# Patient Record
Sex: Female | Born: 1945
Health system: Southern US, Community
[De-identification: ages and names within clinical notes are randomized; demographics above are authoritative.]

## PROBLEM LIST (undated history)

## (undated) DIAGNOSIS — F419 Anxiety disorder, unspecified: Secondary | ICD-10-CM

## (undated) HISTORY — PX: ROBOTIC ASSISTED LAPAROSCOPIC SACROCOLPOPEXY: SHX5388

## (undated) HISTORY — PX: ABDOMINAL HYSTERECTOMY: SHX81

## (undated) HISTORY — PX: TONSILLECTOMY: SUR1361

## (undated) HISTORY — PX: CYST EXCISION: SHX5701

---

## 2009-05-22 ENCOUNTER — Ambulatory Visit: Payer: Self-pay | Admitting: Cardiology

## 2010-05-30 ENCOUNTER — Ambulatory Visit
Admission: RE | Admit: 2010-05-30 | Discharge: 2010-05-30 | Payer: Self-pay | Source: Home / Self Care | Attending: Internal Medicine | Admitting: Internal Medicine

## 2010-06-22 ENCOUNTER — Other Ambulatory Visit (INDEPENDENT_AMBULATORY_CARE_PROVIDER_SITE_OTHER): Payer: Self-pay | Admitting: Internal Medicine

## 2010-06-22 ENCOUNTER — Ambulatory Visit (HOSPITAL_COMMUNITY)
Admission: RE | Admit: 2010-06-22 | Discharge: 2010-06-22 | Disposition: A | Discharge status: Home / Self Care | Payer: Medicare Other | Source: Ambulatory Visit | Attending: Internal Medicine | Admitting: Internal Medicine

## 2010-06-22 ENCOUNTER — Encounter (HOSPITAL_BASED_OUTPATIENT_CLINIC_OR_DEPARTMENT_OTHER): Payer: Medicare Other | Admitting: Internal Medicine

## 2010-06-22 DIAGNOSIS — K6389 Other specified diseases of intestine: Secondary | ICD-10-CM

## 2010-06-22 DIAGNOSIS — E785 Hyperlipidemia, unspecified: Secondary | ICD-10-CM | POA: Insufficient documentation

## 2010-06-22 DIAGNOSIS — Z860100 Personal history of colon polyps, unspecified: Secondary | ICD-10-CM | POA: Insufficient documentation

## 2010-06-22 DIAGNOSIS — K644 Residual hemorrhoidal skin tags: Secondary | ICD-10-CM

## 2010-06-22 DIAGNOSIS — D128 Benign neoplasm of rectum: Secondary | ICD-10-CM | POA: Insufficient documentation

## 2010-06-22 DIAGNOSIS — R197 Diarrhea, unspecified: Secondary | ICD-10-CM | POA: Insufficient documentation

## 2010-06-22 DIAGNOSIS — K621 Rectal polyp: Secondary | ICD-10-CM

## 2010-06-22 DIAGNOSIS — Z7982 Long term (current) use of aspirin: Secondary | ICD-10-CM | POA: Insufficient documentation

## 2010-06-22 DIAGNOSIS — K921 Melena: Secondary | ICD-10-CM

## 2010-06-22 DIAGNOSIS — R198 Other specified symptoms and signs involving the digestive system and abdomen: Secondary | ICD-10-CM

## 2010-07-16 NOTE — Consult Note (Signed)
NAME:  Sarah Coffey, Sarah Coffey              ACCOUNT NO.:  [id number]  MEDICAL RECORD NO.:  [id number]           PATIENT TYPE: AMB  LOCATION:  [place]                               FACILITY: [facility]  PHYSICIAN:  Lionel December, M.D.    DATE OF BIRTH:  [birth year]  DATE OF CONSULTATION: 05/30/2010                                 CONSULTATION   REASON FOR CONSULT:  Pencil thin stools, rectal bleeding.  HISTORY OF PRESENT ILLNESS:  Sarah Coffey is a 65 year old female referred to our office by Dr. Margo Aye in [place], [place].  She is complaining of pencil thin stools and rectal bleeding that she underwent a oophorectomy in March of last year.  She states she has rectal bleeding when she takes Advil.  She takes Advil about 2-3 times a week.  She does see blood on the toilet tissues.  Her stools have been brown.  She is having approximately 6 small stools a day.  She states when she goes to the bathroom, she feels like she is not completely empty.  Her stools are pencil thin.  She also complains of a bloating sensation and weight gain.  Her last colonoscopy was in 2008 in [place] which she reports was normal.  In 2003, she underwent a colonoscopy in [place] and she did have polyps, but I do not have access to those records.  She denies any fever or fatigue.  There has been no appetite changes, no dysphagia. She has had rectal bleeding, but no melena.  There has been no weight loss.  She has actually gained weight.  She is allergic to SULFA.  SURGERY:  She has had a tonsillectomy in 1950s, partial hysterectomy in 1987.  She had oophorectomy in March 2011.  She has a history of high cholesterol and depression.  FAMILY HISTORY:  Her mother is alive in good health at age 51.  Her father is deceased with a history of coronary artery disease  and a bleeding ulcer.  One brother and sister in good health.  SOCIAL HISTORY:  She is widowed.  She is a retired Retail buyer.  She smokes socially x5 years.  She did smoke a half a pack a day x20 years. She  will occasionally drinks wine and Scotch.  She has one child in good health.  SUBJECTIVE:  VITAL SIGNS:  Her weight is 156.5, her height is 5 feet 4 inches, her BMI is 26. MOUTH:  She has natural teeth.  Her oral mucosa is moist.  There are no lesions. EYES:  Her conjunctivae are pink.  Her sclerae are anicteric. NECK:  Her thyroid is normal.  There is no cervical lymphadenopathy. LUNGS:  Clear. HEART:  Regular rate and rhythm. ABDOMEN:  Soft.  Bowel sounds are present.  No masses.  She does have slight tenderness to the epigastric region.  Her stool was guaiac negative today.  She does have external hemorrhoids. EXTREMITIES:  There is no edema to her extremities.  LABORATORY DATA:  On May 16, 2010, hematocrit is 39.2, hemoglobin is 12.8, MCV is 100.3, platelet count is 286.  Creatinine 0.60, albumin 3.8, ALP is 64, BUN is 15, calcium is 9.3, chloride 105, glucose 91, potassium 4.7, SGOT 27, SGPT 27, sodium 140, total bilirubin 0.4, total protein 6.1.  ASSESSMENT:  Sarah Coffey is a 65 year old female presenting with pencil thin stools and she does give a history of rectal bleeding.  Her colonic ulcer needs to be ruled out as well as an AVM.  Also, a colonic carcinoma also needs to be rule out.  RECOMMENDATIONS:  We will schedule a colonoscopy in the near future and the risk and benefits were reviewed with the patient and the patient verbalizes understanding.    ______________________________ Dorene Ar, NP   ______________________________ Lionel December, M.D.    TS/MEDQ  D:  05/30/2010  T:  05/31/2010  Job:  [id number]  cc:   Catalina Pizza, M.D. Fax: [phone]  Electronically Signed by Dorene Ar PA on 06/26/2010 09:29:13 AM Electronically Signed by Lionel December M.D. on 07/16/2010 01:13:35 PM

## 2010-07-16 NOTE — Op Note (Signed)
NAME:  Sarah Coffey, Sarah Coffey              ACCOUNT NO.:  [id number]  MEDICAL RECORD NO.:  [id number]           PATIENT TYPE:  O  LOCATION:  [place]                          FACILITY:  [facility]  PHYSICIAN:  Lionel December, M.D.    DATE OF BIRTH:  07-27-1945  DATE OF PROCEDURE: DATE OF DISCHARGE:  06/22/2010                              OPERATIVE REPORT   PROCEDURE:  Colonoscopy.  INDICATION:  [name] is a 65 year old Caucasian female who has history of colonic polyps, who has developed thin caliber stools since she had oophorectomy in March last year.  She is having 5-6 stools per day.  She also has intermittent hematochezia.  The patient had three polyps removed in a colonoscopy 9 years ago, but none on exam 4 years ago. Procedure risks were reviewed with the patient.  Informed consent was obtained.  MEDICATIONS FOR CONSCIOUS SEDATION:  Demerol 50 mg IV and Versed 5 mg IV.  FINDINGS:  Procedure performed in endoscopy suite.  The patient's vital signs and O2 sat were monitored during the procedure and remained stable.  The patient was placed in left lateral recumbent position. Rectal examination was performed.  No abnormality noted on external or digital exam.  Pentax videoscope was placed through rectum and advanced under vision into sigmoid colon and beyond.  Preparation was satisfactory.  She had a mild mucosal pigmentation consistent with melanosis coli.  No stricture was noted at the 20 segment of the colon. Scope was passed into cecum which was identified by appendiceal orifice and ileocecal valve.  As the scope was withdrawn, colonic mucosa was carefully examined and no masses, diverticula, or stricture was noted. In the rectum, there were few small polyps which had typical appearance of hyperplastic polyp with exception of one and this was ablated via cold biopsy.  Scope was retroflexed to examine anorectal junction and small hemorrhoids noted below the dentate line.  Endoscope  was straightened and withdrawn.  Withdrawal time was 9 minutes.  The patient tolerated the procedure well.  FINAL DIAGNOSES: 1. No evidence of colonic stricture or neoplasm. 2. Mild changes of melanosis coli. 3. Small polyp ablated via cold biopsy from the rectum. 4. External hemorrhoids. 5. I suspect the patient's symptoms would appear to be due to     irritable bowel syndrome.  She does not have any structural     abnormality to account for these symptoms.  RECOMMENDATIONS: 1. She will continue high-fiber diet.  In addition to this, take fiber     supplement 3-4 g daily. 2. She will take dicyclomine 10 mg before breakfast daily.  The     patient will keep stool diary until her OV in 8 weeks.  I will also     ask that she could take few pictures of fecal matter on few     occasions. 3. I will be contacting the patient with results of biopsy as well.     Lionel December, M.D.     NR/MEDQ  D:  06/22/2010  T:  06/22/2010  Job:  [id number]  cc:   Catalina Pizza, M.D. Fax: [phone]  Electronically Signed by Lionel December  M.D. on 07/16/2010 01:12:30 PM

## 2010-07-24 ENCOUNTER — Ambulatory Visit (INDEPENDENT_AMBULATORY_CARE_PROVIDER_SITE_OTHER): Payer: Medicare Other | Admitting: Internal Medicine

## 2010-07-24 DIAGNOSIS — R198 Other specified symptoms and signs involving the digestive system and abdomen: Secondary | ICD-10-CM

## 2011-06-26 DIAGNOSIS — H538 Other visual disturbances: Secondary | ICD-10-CM | POA: Diagnosis not present

## 2011-06-26 DIAGNOSIS — H2510 Age-related nuclear cataract, unspecified eye: Secondary | ICD-10-CM | POA: Diagnosis not present

## 2011-07-10 DIAGNOSIS — E785 Hyperlipidemia, unspecified: Secondary | ICD-10-CM | POA: Diagnosis not present

## 2011-07-10 DIAGNOSIS — E559 Vitamin D deficiency, unspecified: Secondary | ICD-10-CM | POA: Diagnosis not present

## 2011-07-10 DIAGNOSIS — R635 Abnormal weight gain: Secondary | ICD-10-CM | POA: Diagnosis not present

## 2011-07-10 DIAGNOSIS — I10 Essential (primary) hypertension: Secondary | ICD-10-CM | POA: Diagnosis not present

## 2011-07-17 DIAGNOSIS — F411 Generalized anxiety disorder: Secondary | ICD-10-CM | POA: Diagnosis not present

## 2011-07-17 DIAGNOSIS — R635 Abnormal weight gain: Secondary | ICD-10-CM | POA: Diagnosis not present

## 2011-07-17 DIAGNOSIS — E785 Hyperlipidemia, unspecified: Secondary | ICD-10-CM | POA: Diagnosis not present

## 2011-07-17 DIAGNOSIS — F329 Major depressive disorder, single episode, unspecified: Secondary | ICD-10-CM | POA: Diagnosis not present

## 2012-01-14 DIAGNOSIS — Z79899 Other long term (current) drug therapy: Secondary | ICD-10-CM | POA: Diagnosis not present

## 2012-01-14 DIAGNOSIS — E785 Hyperlipidemia, unspecified: Secondary | ICD-10-CM | POA: Diagnosis not present

## 2012-01-18 DIAGNOSIS — F329 Major depressive disorder, single episode, unspecified: Secondary | ICD-10-CM | POA: Diagnosis not present

## 2012-01-18 DIAGNOSIS — F411 Generalized anxiety disorder: Secondary | ICD-10-CM | POA: Diagnosis not present

## 2012-01-18 DIAGNOSIS — E785 Hyperlipidemia, unspecified: Secondary | ICD-10-CM | POA: Diagnosis not present

## 2012-01-18 DIAGNOSIS — R635 Abnormal weight gain: Secondary | ICD-10-CM | POA: Diagnosis not present

## 2012-01-29 DIAGNOSIS — Z23 Encounter for immunization: Secondary | ICD-10-CM | POA: Diagnosis not present

## 2012-07-16 DIAGNOSIS — E785 Hyperlipidemia, unspecified: Secondary | ICD-10-CM | POA: Diagnosis not present

## 2012-07-16 DIAGNOSIS — E559 Vitamin D deficiency, unspecified: Secondary | ICD-10-CM | POA: Diagnosis not present

## 2012-07-16 DIAGNOSIS — F17200 Nicotine dependence, unspecified, uncomplicated: Secondary | ICD-10-CM | POA: Diagnosis not present

## 2012-07-16 DIAGNOSIS — Z0000 Encounter for general adult medical examination without abnormal findings: Secondary | ICD-10-CM | POA: Diagnosis not present

## 2012-08-07 DIAGNOSIS — F17200 Nicotine dependence, unspecified, uncomplicated: Secondary | ICD-10-CM | POA: Diagnosis not present

## 2012-08-07 DIAGNOSIS — M810 Age-related osteoporosis without current pathological fracture: Secondary | ICD-10-CM | POA: Diagnosis not present

## 2012-08-07 DIAGNOSIS — M949 Disorder of cartilage, unspecified: Secondary | ICD-10-CM | POA: Diagnosis not present

## 2012-08-07 DIAGNOSIS — Z780 Asymptomatic menopausal state: Secondary | ICD-10-CM | POA: Diagnosis not present

## 2012-08-07 DIAGNOSIS — M899 Disorder of bone, unspecified: Secondary | ICD-10-CM | POA: Diagnosis not present

## 2012-08-07 DIAGNOSIS — Z79899 Other long term (current) drug therapy: Secondary | ICD-10-CM | POA: Diagnosis not present

## 2012-08-07 DIAGNOSIS — E7800 Pure hypercholesterolemia, unspecified: Secondary | ICD-10-CM | POA: Diagnosis not present

## 2013-01-14 DIAGNOSIS — E785 Hyperlipidemia, unspecified: Secondary | ICD-10-CM | POA: Diagnosis not present

## 2013-01-14 DIAGNOSIS — F17200 Nicotine dependence, unspecified, uncomplicated: Secondary | ICD-10-CM | POA: Diagnosis not present

## 2013-01-14 DIAGNOSIS — Z23 Encounter for immunization: Secondary | ICD-10-CM | POA: Diagnosis not present

## 2013-01-21 DIAGNOSIS — E785 Hyperlipidemia, unspecified: Secondary | ICD-10-CM | POA: Diagnosis not present

## 2013-01-21 DIAGNOSIS — F411 Generalized anxiety disorder: Secondary | ICD-10-CM | POA: Diagnosis not present

## 2013-01-21 DIAGNOSIS — F329 Major depressive disorder, single episode, unspecified: Secondary | ICD-10-CM | POA: Diagnosis not present

## 2013-01-21 DIAGNOSIS — F17200 Nicotine dependence, unspecified, uncomplicated: Secondary | ICD-10-CM | POA: Diagnosis not present

## 2013-07-01 DIAGNOSIS — D239 Other benign neoplasm of skin, unspecified: Secondary | ICD-10-CM | POA: Diagnosis not present

## 2013-08-06 ENCOUNTER — Other Ambulatory Visit: Payer: Self-pay | Admitting: Dermatology

## 2013-08-06 DIAGNOSIS — L723 Sebaceous cyst: Secondary | ICD-10-CM | POA: Diagnosis not present

## 2013-12-16 DIAGNOSIS — D239 Other benign neoplasm of skin, unspecified: Secondary | ICD-10-CM | POA: Diagnosis not present

## 2013-12-16 DIAGNOSIS — L723 Sebaceous cyst: Secondary | ICD-10-CM | POA: Diagnosis not present

## 2013-12-16 DIAGNOSIS — L708 Other acne: Secondary | ICD-10-CM | POA: Diagnosis not present

## 2014-02-11 ENCOUNTER — Other Ambulatory Visit: Payer: Self-pay | Admitting: Dermatology

## 2014-02-11 DIAGNOSIS — L720 Epidermal cyst: Secondary | ICD-10-CM | POA: Diagnosis not present

## 2014-02-11 DIAGNOSIS — L723 Sebaceous cyst: Secondary | ICD-10-CM | POA: Diagnosis not present

## 2014-03-01 DIAGNOSIS — Z23 Encounter for immunization: Secondary | ICD-10-CM | POA: Diagnosis not present

## 2014-07-14 DIAGNOSIS — L732 Hidradenitis suppurativa: Secondary | ICD-10-CM | POA: Diagnosis not present

## 2014-10-02 DIAGNOSIS — W5501XA Bitten by cat, initial encounter: Secondary | ICD-10-CM | POA: Diagnosis not present

## 2014-10-02 DIAGNOSIS — T148 Other injury of unspecified body region: Secondary | ICD-10-CM | POA: Diagnosis not present

## 2015-02-28 DIAGNOSIS — Z23 Encounter for immunization: Secondary | ICD-10-CM | POA: Diagnosis not present

## 2015-04-08 DIAGNOSIS — Z79899 Other long term (current) drug therapy: Secondary | ICD-10-CM | POA: Diagnosis not present

## 2015-04-08 DIAGNOSIS — R079 Chest pain, unspecified: Secondary | ICD-10-CM | POA: Diagnosis not present

## 2015-04-08 DIAGNOSIS — Z5321 Procedure and treatment not carried out due to patient leaving prior to being seen by health care provider: Secondary | ICD-10-CM | POA: Diagnosis not present

## 2015-04-08 DIAGNOSIS — E7800 Pure hypercholesterolemia, unspecified: Secondary | ICD-10-CM | POA: Diagnosis not present

## 2015-04-08 DIAGNOSIS — Z87891 Personal history of nicotine dependence: Secondary | ICD-10-CM | POA: Diagnosis not present

## 2015-04-14 DIAGNOSIS — E782 Mixed hyperlipidemia: Secondary | ICD-10-CM | POA: Diagnosis not present

## 2015-04-14 DIAGNOSIS — R0789 Other chest pain: Secondary | ICD-10-CM | POA: Diagnosis not present

## 2015-04-14 DIAGNOSIS — F411 Generalized anxiety disorder: Secondary | ICD-10-CM | POA: Diagnosis not present

## 2015-04-14 DIAGNOSIS — Z23 Encounter for immunization: Secondary | ICD-10-CM | POA: Diagnosis not present

## 2015-04-14 DIAGNOSIS — R221 Localized swelling, mass and lump, neck: Secondary | ICD-10-CM | POA: Diagnosis not present

## 2015-04-14 DIAGNOSIS — F3289 Other specified depressive episodes: Secondary | ICD-10-CM | POA: Diagnosis not present

## 2015-04-19 DIAGNOSIS — E042 Nontoxic multinodular goiter: Secondary | ICD-10-CM | POA: Diagnosis not present

## 2015-04-19 DIAGNOSIS — E079 Disorder of thyroid, unspecified: Secondary | ICD-10-CM | POA: Diagnosis not present

## 2015-04-25 DIAGNOSIS — E041 Nontoxic single thyroid nodule: Secondary | ICD-10-CM | POA: Diagnosis not present

## 2015-04-25 DIAGNOSIS — E042 Nontoxic multinodular goiter: Secondary | ICD-10-CM | POA: Diagnosis not present

## 2015-05-12 ENCOUNTER — Other Ambulatory Visit: Payer: Self-pay | Admitting: Otolaryngology

## 2015-05-12 ENCOUNTER — Inpatient Hospital Stay
Admission: RE | Admit: 2015-05-12 | Discharge: 2015-05-12 | Disposition: A | Discharge status: Home / Self Care | Payer: Self-pay | Source: Ambulatory Visit | Attending: Otolaryngology | Admitting: Otolaryngology

## 2015-05-12 DIAGNOSIS — E041 Nontoxic single thyroid nodule: Secondary | ICD-10-CM

## 2015-06-01 ENCOUNTER — Ambulatory Visit
Admission: RE | Admit: 2015-06-01 | Discharge: 2015-06-01 | Disposition: A | Discharge status: Home / Self Care | Payer: Medicare Other | Source: Ambulatory Visit | Attending: Otolaryngology | Admitting: Otolaryngology

## 2015-06-01 ENCOUNTER — Other Ambulatory Visit (HOSPITAL_COMMUNITY)
Admission: RE | Admit: 2015-06-01 | Discharge: 2015-06-01 | Disposition: A | Discharge status: Home / Self Care | Payer: Medicare Other | Source: Ambulatory Visit | Attending: Radiology | Admitting: Radiology

## 2015-06-01 DIAGNOSIS — E041 Nontoxic single thyroid nodule: Secondary | ICD-10-CM

## 2015-06-06 DIAGNOSIS — E559 Vitamin D deficiency, unspecified: Secondary | ICD-10-CM | POA: Diagnosis not present

## 2015-06-06 DIAGNOSIS — E782 Mixed hyperlipidemia: Secondary | ICD-10-CM | POA: Diagnosis not present

## 2015-06-06 DIAGNOSIS — F411 Generalized anxiety disorder: Secondary | ICD-10-CM | POA: Diagnosis not present

## 2015-06-06 DIAGNOSIS — Z720 Tobacco use: Secondary | ICD-10-CM | POA: Diagnosis not present

## 2015-06-06 DIAGNOSIS — F3289 Other specified depressive episodes: Secondary | ICD-10-CM | POA: Diagnosis not present

## 2015-06-06 DIAGNOSIS — R221 Localized swelling, mass and lump, neck: Secondary | ICD-10-CM | POA: Diagnosis not present

## 2015-06-08 DIAGNOSIS — H538 Other visual disturbances: Secondary | ICD-10-CM | POA: Diagnosis not present

## 2015-06-08 DIAGNOSIS — E041 Nontoxic single thyroid nodule: Secondary | ICD-10-CM | POA: Diagnosis not present

## 2015-06-08 DIAGNOSIS — H2513 Age-related nuclear cataract, bilateral: Secondary | ICD-10-CM | POA: Diagnosis not present

## 2015-06-13 DIAGNOSIS — F411 Generalized anxiety disorder: Secondary | ICD-10-CM | POA: Diagnosis not present

## 2015-06-13 DIAGNOSIS — E782 Mixed hyperlipidemia: Secondary | ICD-10-CM | POA: Diagnosis not present

## 2015-06-13 DIAGNOSIS — E041 Nontoxic single thyroid nodule: Secondary | ICD-10-CM | POA: Diagnosis not present

## 2015-06-13 DIAGNOSIS — Z1389 Encounter for screening for other disorder: Secondary | ICD-10-CM | POA: Diagnosis not present

## 2015-06-13 DIAGNOSIS — Z720 Tobacco use: Secondary | ICD-10-CM | POA: Diagnosis not present

## 2015-06-13 DIAGNOSIS — K635 Polyp of colon: Secondary | ICD-10-CM | POA: Diagnosis not present

## 2015-06-13 DIAGNOSIS — G4700 Insomnia, unspecified: Secondary | ICD-10-CM | POA: Diagnosis not present

## 2015-06-13 DIAGNOSIS — F3289 Other specified depressive episodes: Secondary | ICD-10-CM | POA: Diagnosis not present

## 2015-06-13 DIAGNOSIS — Z0001 Encounter for general adult medical examination with abnormal findings: Secondary | ICD-10-CM | POA: Diagnosis not present

## 2015-06-13 DIAGNOSIS — M8580 Other specified disorders of bone density and structure, unspecified site: Secondary | ICD-10-CM | POA: Diagnosis not present

## 2015-06-16 DIAGNOSIS — Z79899 Other long term (current) drug therapy: Secondary | ICD-10-CM | POA: Diagnosis not present

## 2015-06-16 DIAGNOSIS — Z90710 Acquired absence of both cervix and uterus: Secondary | ICD-10-CM | POA: Diagnosis not present

## 2015-06-16 DIAGNOSIS — Z87891 Personal history of nicotine dependence: Secondary | ICD-10-CM | POA: Diagnosis not present

## 2015-06-16 DIAGNOSIS — E28319 Asymptomatic premature menopause: Secondary | ICD-10-CM | POA: Diagnosis not present

## 2015-06-16 DIAGNOSIS — Z780 Asymptomatic menopausal state: Secondary | ICD-10-CM | POA: Diagnosis not present

## 2015-06-16 DIAGNOSIS — M8589 Other specified disorders of bone density and structure, multiple sites: Secondary | ICD-10-CM | POA: Diagnosis not present

## 2015-06-16 DIAGNOSIS — Z7982 Long term (current) use of aspirin: Secondary | ICD-10-CM | POA: Diagnosis not present

## 2015-06-16 DIAGNOSIS — Z981 Arthrodesis status: Secondary | ICD-10-CM | POA: Diagnosis not present

## 2015-06-16 DIAGNOSIS — E7800 Pure hypercholesterolemia, unspecified: Secondary | ICD-10-CM | POA: Diagnosis not present

## 2015-06-20 DIAGNOSIS — R928 Other abnormal and inconclusive findings on diagnostic imaging of breast: Secondary | ICD-10-CM | POA: Diagnosis not present

## 2015-06-20 DIAGNOSIS — Z1231 Encounter for screening mammogram for malignant neoplasm of breast: Secondary | ICD-10-CM | POA: Diagnosis not present

## 2015-06-29 DIAGNOSIS — N6001 Solitary cyst of right breast: Secondary | ICD-10-CM | POA: Diagnosis not present

## 2015-06-29 DIAGNOSIS — N63 Unspecified lump in breast: Secondary | ICD-10-CM | POA: Diagnosis not present

## 2015-12-08 DIAGNOSIS — E041 Nontoxic single thyroid nodule: Secondary | ICD-10-CM | POA: Diagnosis not present

## 2015-12-08 DIAGNOSIS — E782 Mixed hyperlipidemia: Secondary | ICD-10-CM | POA: Diagnosis not present

## 2015-12-08 DIAGNOSIS — F411 Generalized anxiety disorder: Secondary | ICD-10-CM | POA: Diagnosis not present

## 2015-12-12 ENCOUNTER — Other Ambulatory Visit: Payer: Self-pay | Admitting: Dermatology

## 2015-12-12 DIAGNOSIS — C4491 Basal cell carcinoma of skin, unspecified: Secondary | ICD-10-CM

## 2015-12-12 DIAGNOSIS — D485 Neoplasm of uncertain behavior of skin: Secondary | ICD-10-CM | POA: Diagnosis not present

## 2015-12-12 DIAGNOSIS — Z6826 Body mass index (BMI) 26.0-26.9, adult: Secondary | ICD-10-CM | POA: Diagnosis not present

## 2015-12-12 DIAGNOSIS — D2239 Melanocytic nevi of other parts of face: Secondary | ICD-10-CM | POA: Diagnosis not present

## 2015-12-12 DIAGNOSIS — L820 Inflamed seborrheic keratosis: Secondary | ICD-10-CM | POA: Diagnosis not present

## 2015-12-12 DIAGNOSIS — Z720 Tobacco use: Secondary | ICD-10-CM | POA: Diagnosis not present

## 2015-12-12 DIAGNOSIS — F3289 Other specified depressive episodes: Secondary | ICD-10-CM | POA: Diagnosis not present

## 2015-12-12 DIAGNOSIS — K635 Polyp of colon: Secondary | ICD-10-CM | POA: Diagnosis not present

## 2015-12-12 DIAGNOSIS — E041 Nontoxic single thyroid nodule: Secondary | ICD-10-CM | POA: Diagnosis not present

## 2015-12-12 DIAGNOSIS — J209 Acute bronchitis, unspecified: Secondary | ICD-10-CM | POA: Diagnosis not present

## 2015-12-12 DIAGNOSIS — C44319 Basal cell carcinoma of skin of other parts of face: Secondary | ICD-10-CM | POA: Diagnosis not present

## 2015-12-12 DIAGNOSIS — F411 Generalized anxiety disorder: Secondary | ICD-10-CM | POA: Diagnosis not present

## 2015-12-12 DIAGNOSIS — L821 Other seborrheic keratosis: Secondary | ICD-10-CM | POA: Diagnosis not present

## 2015-12-12 DIAGNOSIS — D239 Other benign neoplasm of skin, unspecified: Secondary | ICD-10-CM | POA: Diagnosis not present

## 2015-12-12 DIAGNOSIS — E782 Mixed hyperlipidemia: Secondary | ICD-10-CM | POA: Diagnosis not present

## 2015-12-12 HISTORY — DX: Basal cell carcinoma of skin, unspecified: C44.91

## 2015-12-29 ENCOUNTER — Other Ambulatory Visit: Payer: Self-pay | Admitting: Dermatology

## 2015-12-29 DIAGNOSIS — C44319 Basal cell carcinoma of skin of other parts of face: Secondary | ICD-10-CM | POA: Diagnosis not present

## 2016-01-04 DIAGNOSIS — Z4802 Encounter for removal of sutures: Secondary | ICD-10-CM | POA: Diagnosis not present

## 2016-02-06 DIAGNOSIS — Z23 Encounter for immunization: Secondary | ICD-10-CM | POA: Diagnosis not present

## 2016-02-27 IMAGING — US US THYROID BIOPSY
1 series · 13 of 14 positions shown · non-contrast
Comparison: Outside US Soft Tissue Neck

MEDICATIONS:
5 cc 1% lidocaine

COMPLICATIONS:
None immediate.

INDICATION: Indeterminate thyroid nodule

Left thyroid nodule 4.7 cm
EXAM:
ULTRASOUND GUIDED FINE NEEDLE ASPIRATION OF INDETERMINATE THYROID
NODULE
TECHNIQUE: Informed written consent was obtained from the patient after a
discussion of the risks, benefits and alternatives to treatment.
Questions regarding the procedure were encouraged and answered. A
timeout was performed prior to the initiation of the procedure.

[Series 1: us thyroid biopsy · 0.08mm/px · 14 acquisitions, 13 frames shown]
[im 1/14]
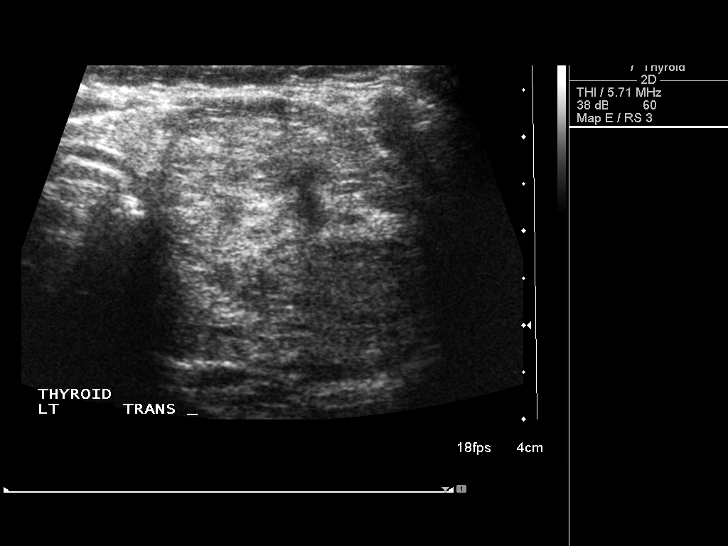
[im 2/14]
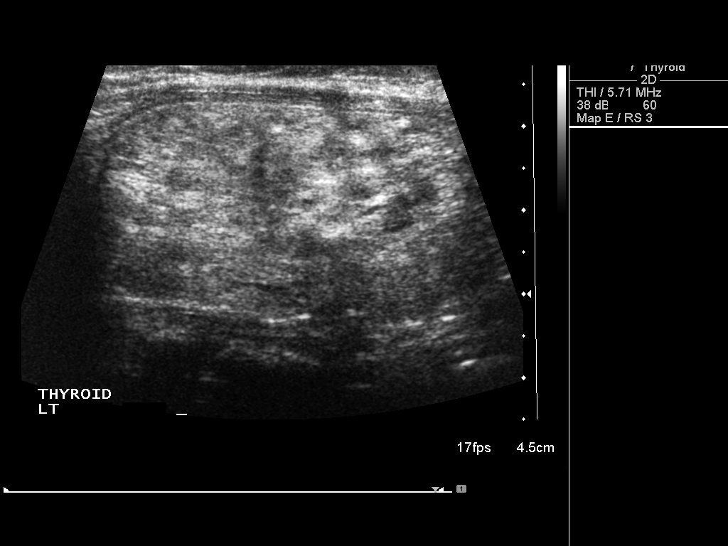
[im 3/14]
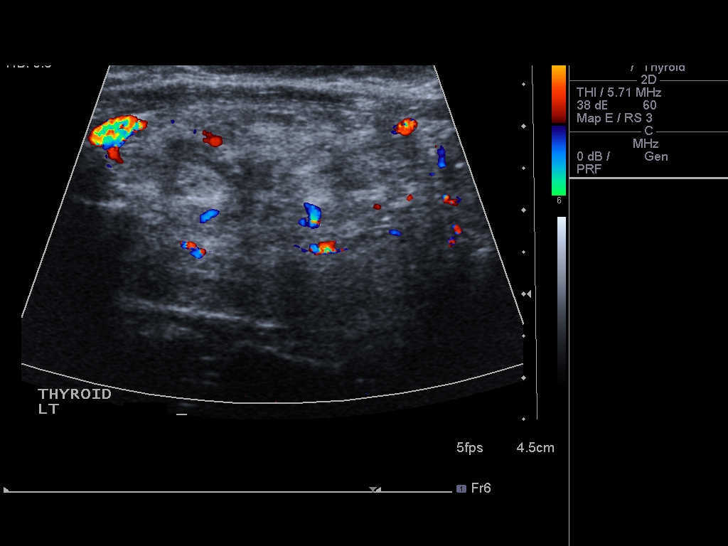
[im 4/14]
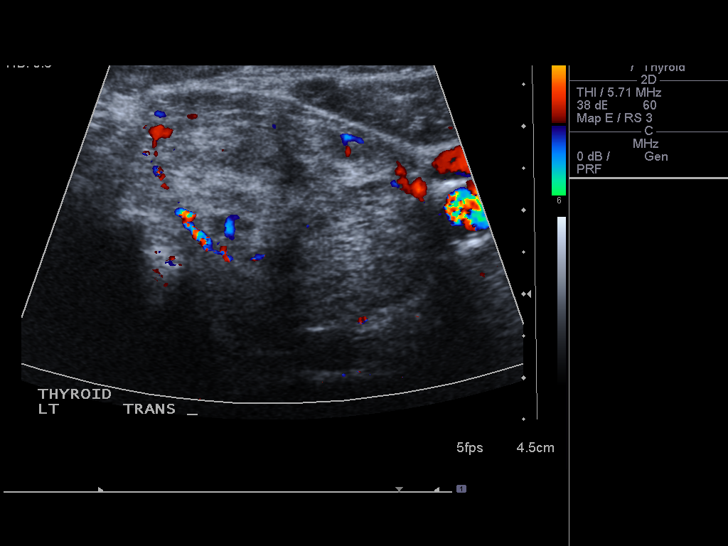
[im 5/14]
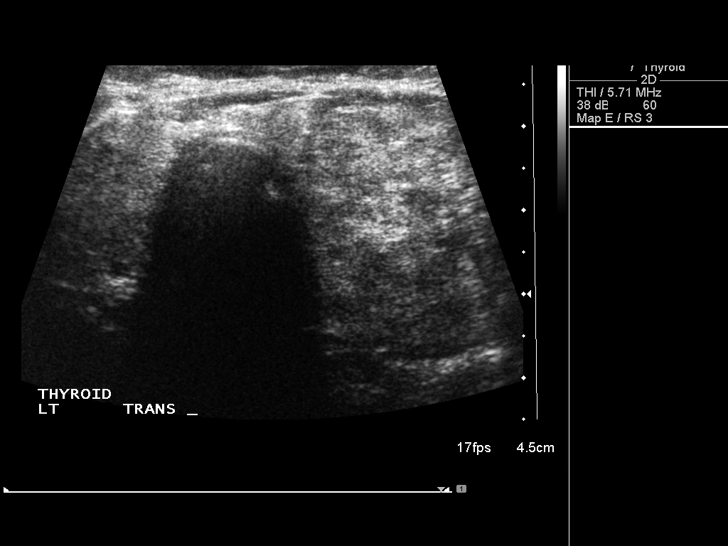
[im 6/14]
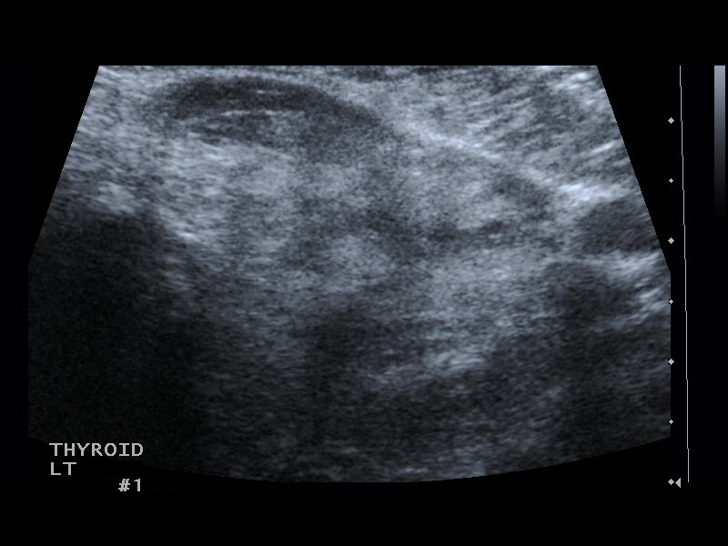
[im 8/14]
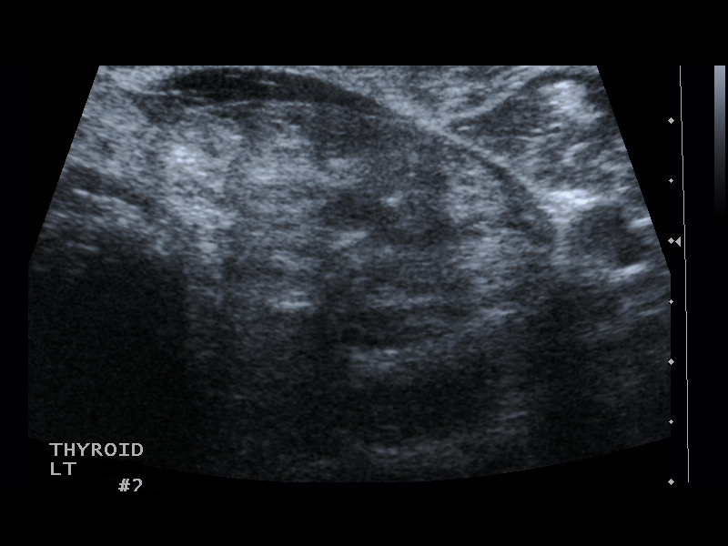
[im 9/14]
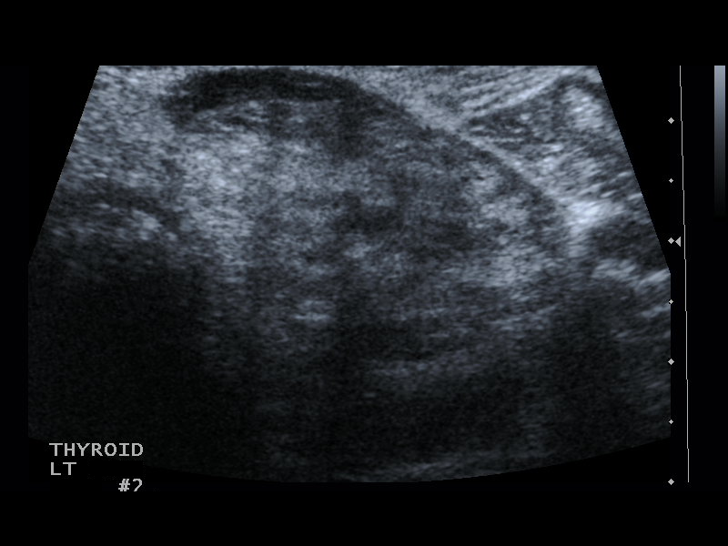
[im 10/14]
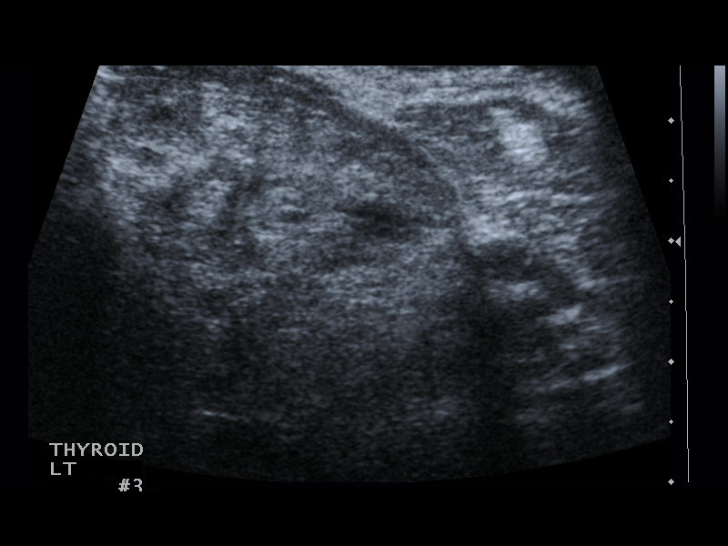
[im 11/14]
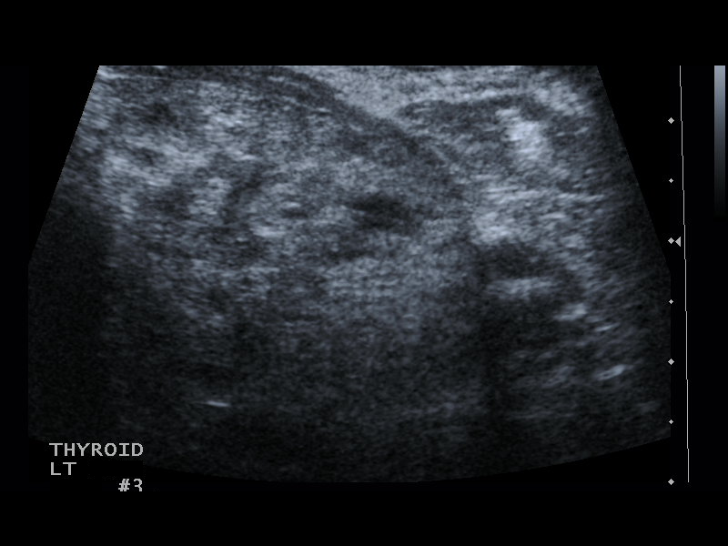
[im 12/14]
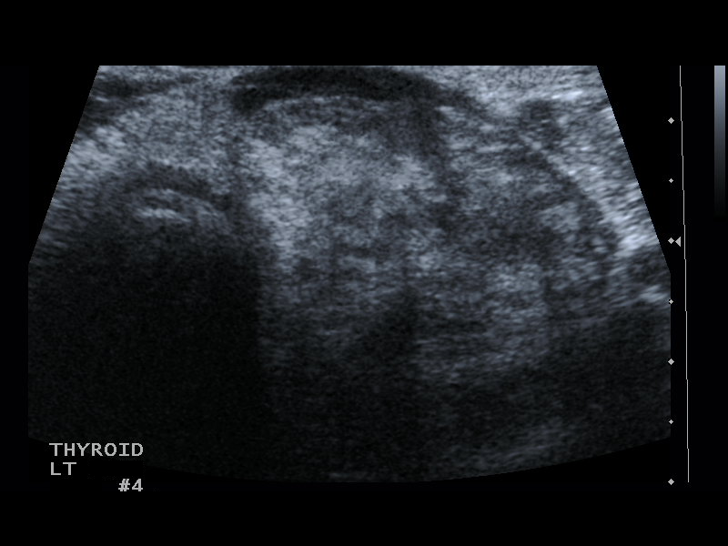
[im 13/14]
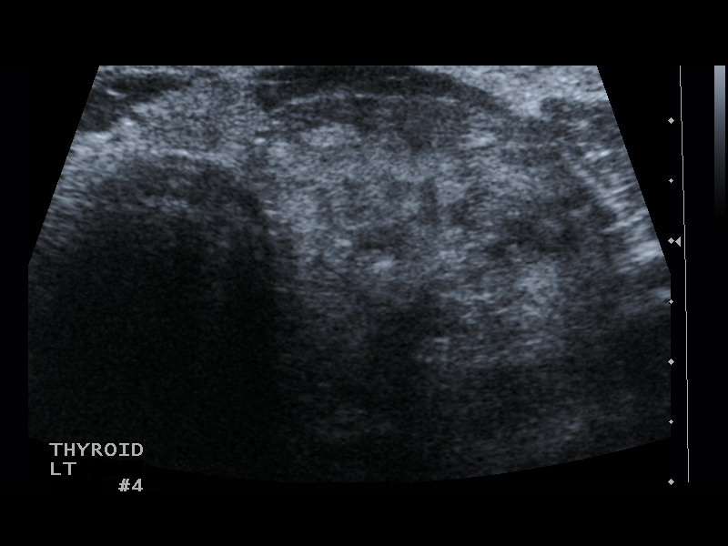
[im 14/14]
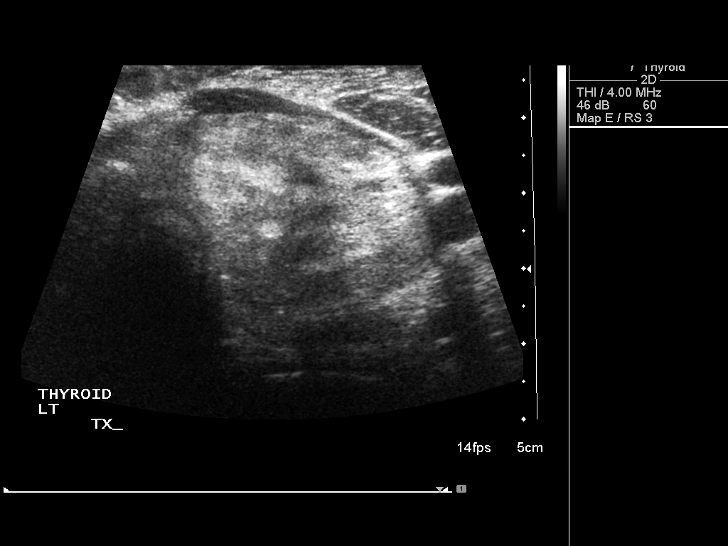

[13 of 14 positions shown; findings below may reference images not displayed]

Pre-procedural ultrasound scanning demonstrated Left thyroid nodule

The procedure was planned. The neck was prepped in the usual sterile
fashion, and a sterile drape was applied covering the operative
field. A timeout was performed prior to the initiation of the
procedure. Local anesthesia was provided with 1% lidocaine.

Under direct ultrasound guidance, 4 FNA biopsies were performed of
the left thyroid nodule with a 25 gauge needle. The samples were
prepared and submitted to pathology.

Limited post procedural scanning was negative for hematoma or
additional complication. Dressings were placed. The patient
tolerated the above procedures procedure well without immediate
postprocedural complication.
IMPRESSION: Technically successful ultrasound guided fine needle aspiration of
left thyroid nodule

Read by:  Eii Kevin Espino

## 2016-06-14 DIAGNOSIS — G4700 Insomnia, unspecified: Secondary | ICD-10-CM | POA: Diagnosis not present

## 2016-06-14 DIAGNOSIS — R739 Hyperglycemia, unspecified: Secondary | ICD-10-CM | POA: Diagnosis not present

## 2016-06-14 DIAGNOSIS — E559 Vitamin D deficiency, unspecified: Secondary | ICD-10-CM | POA: Diagnosis not present

## 2016-06-14 DIAGNOSIS — E041 Nontoxic single thyroid nodule: Secondary | ICD-10-CM | POA: Diagnosis not present

## 2016-06-14 DIAGNOSIS — E782 Mixed hyperlipidemia: Secondary | ICD-10-CM | POA: Diagnosis not present

## 2016-06-14 DIAGNOSIS — Z720 Tobacco use: Secondary | ICD-10-CM | POA: Diagnosis not present

## 2016-06-14 DIAGNOSIS — F411 Generalized anxiety disorder: Secondary | ICD-10-CM | POA: Diagnosis not present

## 2016-06-19 DIAGNOSIS — Z720 Tobacco use: Secondary | ICD-10-CM | POA: Diagnosis not present

## 2016-06-19 DIAGNOSIS — Z6826 Body mass index (BMI) 26.0-26.9, adult: Secondary | ICD-10-CM | POA: Diagnosis not present

## 2016-06-19 DIAGNOSIS — E041 Nontoxic single thyroid nodule: Secondary | ICD-10-CM | POA: Diagnosis not present

## 2016-06-19 DIAGNOSIS — Z0000 Encounter for general adult medical examination without abnormal findings: Secondary | ICD-10-CM | POA: Diagnosis not present

## 2016-06-19 DIAGNOSIS — K635 Polyp of colon: Secondary | ICD-10-CM | POA: Diagnosis not present

## 2016-06-19 DIAGNOSIS — F411 Generalized anxiety disorder: Secondary | ICD-10-CM | POA: Diagnosis not present

## 2016-06-19 DIAGNOSIS — Z1389 Encounter for screening for other disorder: Secondary | ICD-10-CM | POA: Diagnosis not present

## 2016-06-19 DIAGNOSIS — E782 Mixed hyperlipidemia: Secondary | ICD-10-CM | POA: Diagnosis not present

## 2016-12-17 DIAGNOSIS — E782 Mixed hyperlipidemia: Secondary | ICD-10-CM | POA: Diagnosis not present

## 2016-12-19 DIAGNOSIS — F411 Generalized anxiety disorder: Secondary | ICD-10-CM | POA: Diagnosis not present

## 2016-12-19 DIAGNOSIS — E041 Nontoxic single thyroid nodule: Secondary | ICD-10-CM | POA: Diagnosis not present

## 2016-12-19 DIAGNOSIS — E782 Mixed hyperlipidemia: Secondary | ICD-10-CM | POA: Diagnosis not present

## 2016-12-19 DIAGNOSIS — Z720 Tobacco use: Secondary | ICD-10-CM | POA: Diagnosis not present

## 2016-12-19 DIAGNOSIS — Z6826 Body mass index (BMI) 26.0-26.9, adult: Secondary | ICD-10-CM | POA: Diagnosis not present

## 2016-12-25 DIAGNOSIS — E042 Nontoxic multinodular goiter: Secondary | ICD-10-CM | POA: Diagnosis not present

## 2016-12-26 DIAGNOSIS — H04123 Dry eye syndrome of bilateral lacrimal glands: Secondary | ICD-10-CM | POA: Diagnosis not present

## 2017-01-04 DIAGNOSIS — Z23 Encounter for immunization: Secondary | ICD-10-CM | POA: Diagnosis not present

## 2017-01-18 DIAGNOSIS — Z1231 Encounter for screening mammogram for malignant neoplasm of breast: Secondary | ICD-10-CM | POA: Diagnosis not present

## 2017-06-24 DIAGNOSIS — R0789 Other chest pain: Secondary | ICD-10-CM | POA: Diagnosis not present

## 2017-06-24 DIAGNOSIS — E782 Mixed hyperlipidemia: Secondary | ICD-10-CM | POA: Diagnosis not present

## 2017-06-24 DIAGNOSIS — G4700 Insomnia, unspecified: Secondary | ICD-10-CM | POA: Diagnosis not present

## 2017-06-24 DIAGNOSIS — E559 Vitamin D deficiency, unspecified: Secondary | ICD-10-CM | POA: Diagnosis not present

## 2017-06-24 DIAGNOSIS — F411 Generalized anxiety disorder: Secondary | ICD-10-CM | POA: Diagnosis not present

## 2017-06-24 DIAGNOSIS — Z720 Tobacco use: Secondary | ICD-10-CM | POA: Diagnosis not present

## 2017-06-26 DIAGNOSIS — E782 Mixed hyperlipidemia: Secondary | ICD-10-CM | POA: Diagnosis not present

## 2017-06-26 DIAGNOSIS — E041 Nontoxic single thyroid nodule: Secondary | ICD-10-CM | POA: Diagnosis not present

## 2017-06-26 DIAGNOSIS — Z6827 Body mass index (BMI) 27.0-27.9, adult: Secondary | ICD-10-CM | POA: Diagnosis not present

## 2017-06-26 DIAGNOSIS — M8580 Other specified disorders of bone density and structure, unspecified site: Secondary | ICD-10-CM | POA: Diagnosis not present

## 2017-06-26 DIAGNOSIS — G4700 Insomnia, unspecified: Secondary | ICD-10-CM | POA: Diagnosis not present

## 2017-06-26 DIAGNOSIS — F411 Generalized anxiety disorder: Secondary | ICD-10-CM | POA: Diagnosis not present

## 2017-06-26 DIAGNOSIS — K635 Polyp of colon: Secondary | ICD-10-CM | POA: Diagnosis not present

## 2017-06-26 DIAGNOSIS — Z0001 Encounter for general adult medical examination with abnormal findings: Secondary | ICD-10-CM | POA: Diagnosis not present

## 2017-07-05 ENCOUNTER — Other Ambulatory Visit: Payer: Self-pay | Admitting: Otolaryngology

## 2017-07-05 DIAGNOSIS — E079 Disorder of thyroid, unspecified: Secondary | ICD-10-CM

## 2017-08-01 DIAGNOSIS — M8589 Other specified disorders of bone density and structure, multiple sites: Secondary | ICD-10-CM | POA: Diagnosis not present

## 2017-08-01 DIAGNOSIS — M85852 Other specified disorders of bone density and structure, left thigh: Secondary | ICD-10-CM | POA: Diagnosis not present

## 2017-08-05 ENCOUNTER — Other Ambulatory Visit: Payer: Self-pay | Admitting: Otolaryngology

## 2017-08-05 ENCOUNTER — Ambulatory Visit
Admission: RE | Admit: 2017-08-05 | Discharge: 2017-08-05 | Disposition: A | Discharge status: Home / Self Care | Payer: Self-pay | Source: Ambulatory Visit | Attending: Otolaryngology | Admitting: Otolaryngology

## 2017-08-05 DIAGNOSIS — E079 Disorder of thyroid, unspecified: Secondary | ICD-10-CM

## 2017-12-25 DIAGNOSIS — Z681 Body mass index (BMI) 19 or less, adult: Secondary | ICD-10-CM | POA: Diagnosis not present

## 2017-12-25 DIAGNOSIS — E782 Mixed hyperlipidemia: Secondary | ICD-10-CM | POA: Diagnosis not present

## 2017-12-25 DIAGNOSIS — G4700 Insomnia, unspecified: Secondary | ICD-10-CM | POA: Diagnosis not present

## 2017-12-25 DIAGNOSIS — K635 Polyp of colon: Secondary | ICD-10-CM | POA: Diagnosis not present

## 2017-12-25 DIAGNOSIS — F411 Generalized anxiety disorder: Secondary | ICD-10-CM | POA: Diagnosis not present

## 2017-12-25 DIAGNOSIS — E041 Nontoxic single thyroid nodule: Secondary | ICD-10-CM | POA: Diagnosis not present

## 2018-03-09 DIAGNOSIS — Z23 Encounter for immunization: Secondary | ICD-10-CM | POA: Diagnosis not present

## 2018-03-26 DIAGNOSIS — L03012 Cellulitis of left finger: Secondary | ICD-10-CM | POA: Diagnosis not present

## 2018-03-26 DIAGNOSIS — M19042 Primary osteoarthritis, left hand: Secondary | ICD-10-CM | POA: Diagnosis not present

## 2018-03-26 DIAGNOSIS — Z6827 Body mass index (BMI) 27.0-27.9, adult: Secondary | ICD-10-CM | POA: Diagnosis not present

## 2018-06-17 DIAGNOSIS — M79642 Pain in left hand: Secondary | ICD-10-CM | POA: Diagnosis not present

## 2018-06-27 DIAGNOSIS — E041 Nontoxic single thyroid nodule: Secondary | ICD-10-CM | POA: Diagnosis not present

## 2018-06-27 DIAGNOSIS — E559 Vitamin D deficiency, unspecified: Secondary | ICD-10-CM | POA: Diagnosis not present

## 2018-06-27 DIAGNOSIS — E782 Mixed hyperlipidemia: Secondary | ICD-10-CM | POA: Diagnosis not present

## 2018-06-27 DIAGNOSIS — F411 Generalized anxiety disorder: Secondary | ICD-10-CM | POA: Diagnosis not present

## 2018-06-27 DIAGNOSIS — R739 Hyperglycemia, unspecified: Secondary | ICD-10-CM | POA: Diagnosis not present

## 2018-07-02 DIAGNOSIS — D72829 Elevated white blood cell count, unspecified: Secondary | ICD-10-CM | POA: Diagnosis not present

## 2018-07-02 DIAGNOSIS — Z6826 Body mass index (BMI) 26.0-26.9, adult: Secondary | ICD-10-CM | POA: Diagnosis not present

## 2018-07-02 DIAGNOSIS — K635 Polyp of colon: Secondary | ICD-10-CM | POA: Diagnosis not present

## 2018-07-02 DIAGNOSIS — F411 Generalized anxiety disorder: Secondary | ICD-10-CM | POA: Diagnosis not present

## 2018-07-02 DIAGNOSIS — E041 Nontoxic single thyroid nodule: Secondary | ICD-10-CM | POA: Diagnosis not present

## 2018-07-02 DIAGNOSIS — E782 Mixed hyperlipidemia: Secondary | ICD-10-CM | POA: Diagnosis not present

## 2018-07-02 DIAGNOSIS — M19042 Primary osteoarthritis, left hand: Secondary | ICD-10-CM | POA: Diagnosis not present

## 2018-07-02 DIAGNOSIS — G4700 Insomnia, unspecified: Secondary | ICD-10-CM | POA: Diagnosis not present

## 2018-12-23 DIAGNOSIS — Z23 Encounter for immunization: Secondary | ICD-10-CM | POA: Diagnosis not present

## 2019-01-08 DIAGNOSIS — F411 Generalized anxiety disorder: Secondary | ICD-10-CM | POA: Diagnosis not present

## 2019-01-08 DIAGNOSIS — E782 Mixed hyperlipidemia: Secondary | ICD-10-CM | POA: Diagnosis not present

## 2019-01-08 DIAGNOSIS — E041 Nontoxic single thyroid nodule: Secondary | ICD-10-CM | POA: Diagnosis not present

## 2019-01-08 DIAGNOSIS — Z720 Tobacco use: Secondary | ICD-10-CM | POA: Diagnosis not present

## 2019-01-14 DIAGNOSIS — Z6827 Body mass index (BMI) 27.0-27.9, adult: Secondary | ICD-10-CM | POA: Diagnosis not present

## 2019-01-14 DIAGNOSIS — K635 Polyp of colon: Secondary | ICD-10-CM | POA: Diagnosis not present

## 2019-01-14 DIAGNOSIS — D72829 Elevated white blood cell count, unspecified: Secondary | ICD-10-CM | POA: Diagnosis not present

## 2019-01-14 DIAGNOSIS — F411 Generalized anxiety disorder: Secondary | ICD-10-CM | POA: Diagnosis not present

## 2019-01-14 DIAGNOSIS — E041 Nontoxic single thyroid nodule: Secondary | ICD-10-CM | POA: Diagnosis not present

## 2019-01-14 DIAGNOSIS — E782 Mixed hyperlipidemia: Secondary | ICD-10-CM | POA: Diagnosis not present

## 2019-03-30 ENCOUNTER — Other Ambulatory Visit: Payer: Self-pay

## 2019-05-28 ENCOUNTER — Ambulatory Visit: Payer: Medicare Other | Attending: Internal Medicine

## 2019-05-28 DIAGNOSIS — Z23 Encounter for immunization: Secondary | ICD-10-CM | POA: Insufficient documentation

## 2019-05-28 NOTE — Progress Notes (Signed)
   Covid-19 Vaccination Clinic  Name:  ASHLEYROSE KEVILLE    MRN: [id number] DOB: November 22, 1945  05/28/2019  Ms. Falana was observed post Covid-19 immunization for 15 minutes without incidence. She was provided with Vaccine Information Sheet and instruction to access the V-Safe system.   Ms. Shearon was instructed to call 911 with any severe reactions post vaccine: Marland Kitchen Difficulty breathing  . Swelling of your face and throat  . A fast heartbeat  . A bad rash all over your body  . Dizziness and weakness    Immunizations Administered    Name Date Dose VIS Date Route   Pfizer COVID-19 Vaccine 05/28/2019  3:05 PM 0.3 mL 04/17/2019 Intramuscular   Manufacturer: [place]   Lot: [id number]   [place]: [id number]

## 2019-05-29 ENCOUNTER — Ambulatory Visit: Payer: Medicare Other

## 2019-06-18 ENCOUNTER — Ambulatory Visit: Payer: Medicare Other

## 2019-06-18 ENCOUNTER — Ambulatory Visit: Payer: Medicare Other | Attending: Internal Medicine

## 2019-06-18 DIAGNOSIS — Z23 Encounter for immunization: Secondary | ICD-10-CM | POA: Insufficient documentation

## 2019-06-18 NOTE — Progress Notes (Signed)
   Covid-19 Vaccination Clinic  Name:  Sarah Coffey    MRN: [id number] DOB: 09-10-1945  06/18/2019  [name] was observed post Covid-19 immunization for 15 minutes without incidence. She was provided with Vaccine Information Sheet and instruction to access the V-Safe system.   [name] was instructed to call 911 with any severe reactions post vaccine: Marland Kitchen Difficulty breathing  . Swelling of your face and throat  . A fast heartbeat  . A bad rash all over your body  . Dizziness and weakness    Immunizations Administered    Name Date Dose VIS Date Route   Pfizer COVID-19 Vaccine 06/18/2019 12:26 PM 0.3 mL 04/17/2019 Intramuscular   Manufacturer: [place]   Lot: [id number]   [place]: [id number]

## 2019-07-01 DIAGNOSIS — D229 Melanocytic nevi, unspecified: Secondary | ICD-10-CM | POA: Diagnosis not present

## 2019-07-01 DIAGNOSIS — L821 Other seborrheic keratosis: Secondary | ICD-10-CM | POA: Diagnosis not present

## 2019-07-01 DIAGNOSIS — L720 Epidermal cyst: Secondary | ICD-10-CM | POA: Diagnosis not present

## 2019-07-03 DIAGNOSIS — I10 Essential (primary) hypertension: Secondary | ICD-10-CM | POA: Diagnosis not present

## 2019-07-03 DIAGNOSIS — E7849 Other hyperlipidemia: Secondary | ICD-10-CM | POA: Diagnosis not present

## 2019-07-06 DIAGNOSIS — Z0001 Encounter for general adult medical examination with abnormal findings: Secondary | ICD-10-CM | POA: Diagnosis not present

## 2019-07-06 DIAGNOSIS — E782 Mixed hyperlipidemia: Secondary | ICD-10-CM | POA: Diagnosis not present

## 2019-07-08 DIAGNOSIS — E782 Mixed hyperlipidemia: Secondary | ICD-10-CM | POA: Diagnosis not present

## 2019-07-08 DIAGNOSIS — Z0001 Encounter for general adult medical examination with abnormal findings: Secondary | ICD-10-CM | POA: Diagnosis not present

## 2019-07-08 DIAGNOSIS — Z6827 Body mass index (BMI) 27.0-27.9, adult: Secondary | ICD-10-CM | POA: Diagnosis not present

## 2019-07-08 DIAGNOSIS — D72829 Elevated white blood cell count, unspecified: Secondary | ICD-10-CM | POA: Diagnosis not present

## 2019-07-08 DIAGNOSIS — Z23 Encounter for immunization: Secondary | ICD-10-CM | POA: Diagnosis not present

## 2019-07-08 DIAGNOSIS — K635 Polyp of colon: Secondary | ICD-10-CM | POA: Diagnosis not present

## 2019-07-08 DIAGNOSIS — R030 Elevated blood-pressure reading, without diagnosis of hypertension: Secondary | ICD-10-CM | POA: Diagnosis not present

## 2019-07-08 DIAGNOSIS — E041 Nontoxic single thyroid nodule: Secondary | ICD-10-CM | POA: Diagnosis not present

## 2019-07-15 DIAGNOSIS — I10 Essential (primary) hypertension: Secondary | ICD-10-CM | POA: Diagnosis not present

## 2019-07-22 DIAGNOSIS — K635 Polyp of colon: Secondary | ICD-10-CM | POA: Diagnosis not present

## 2019-07-22 DIAGNOSIS — E782 Mixed hyperlipidemia: Secondary | ICD-10-CM | POA: Diagnosis not present

## 2019-07-22 DIAGNOSIS — D72829 Elevated white blood cell count, unspecified: Secondary | ICD-10-CM | POA: Diagnosis not present

## 2019-07-22 DIAGNOSIS — Z87891 Personal history of nicotine dependence: Secondary | ICD-10-CM | POA: Diagnosis not present

## 2019-07-22 DIAGNOSIS — J439 Emphysema, unspecified: Secondary | ICD-10-CM | POA: Diagnosis not present

## 2019-07-22 DIAGNOSIS — E041 Nontoxic single thyroid nodule: Secondary | ICD-10-CM | POA: Diagnosis not present

## 2019-07-22 DIAGNOSIS — F411 Generalized anxiety disorder: Secondary | ICD-10-CM | POA: Diagnosis not present

## 2019-07-22 DIAGNOSIS — Z122 Encounter for screening for malignant neoplasm of respiratory organs: Secondary | ICD-10-CM | POA: Diagnosis not present

## 2019-07-22 DIAGNOSIS — Z6827 Body mass index (BMI) 27.0-27.9, adult: Secondary | ICD-10-CM | POA: Diagnosis not present

## 2019-07-22 DIAGNOSIS — I10 Essential (primary) hypertension: Secondary | ICD-10-CM | POA: Diagnosis not present

## 2019-07-22 DIAGNOSIS — I700 Atherosclerosis of aorta: Secondary | ICD-10-CM | POA: Diagnosis not present

## 2019-07-23 ENCOUNTER — Encounter: Payer: Self-pay | Admitting: Dermatology

## 2019-07-23 ENCOUNTER — Other Ambulatory Visit: Payer: Self-pay

## 2019-07-23 ENCOUNTER — Ambulatory Visit (INDEPENDENT_AMBULATORY_CARE_PROVIDER_SITE_OTHER): Payer: Medicare Other | Admitting: Dermatology

## 2019-07-23 DIAGNOSIS — L720 Epidermal cyst: Secondary | ICD-10-CM | POA: Diagnosis not present

## 2019-07-23 DIAGNOSIS — L729 Follicular cyst of the skin and subcutaneous tissue, unspecified: Secondary | ICD-10-CM

## 2019-07-23 NOTE — Patient Instructions (Signed)

## 2019-07-24 NOTE — Progress Notes (Addendum)
   Follow-Up Visit   Subjective  Sarah Coffey is a 74 y.o. female who presents for the following: Procedure (Patient, last seen on 07/01/2019, returns to the clinic today for cyst removal from mid back. ).  Cyst Location: Back Duration: Years Quality: Stable Associated Signs/Symptoms: Modifying Factors:  Severity:  Timing: Context: Patient requests removal  The following portions of the chart were reviewed this encounter and updated as appropriate:     Objective  Well appearing patient in no apparent distress; mood and affect are within normal limits.  A focused examination was performed including Face and back. Relevant physical exam findings are noted in the Assessment and Plan.  Objective  Mid Back: 1.5cm non-inflamed deep dermal nodule  Images    Assessment & Plan  Cyst of skin Mid Back  4-0 Nylon x3 Size of lesion: 1.5c Excision with simple clousure, pressure dressing.  Skin excision - Mid Back  Lesion length (cm):  1.5 Lesion width (cm):  1.5 Margin per side (cm):  0 Total excision diameter (cm):  1.5 Informed consent: discussed and consent obtained   Timeout: patient name, date of birth, surgical site, and procedure verified   Procedure prep:  Patient was prepped and draped in usual sterile fashion Prep type:  Chlorhexidine Anesthesia: the lesion was anesthetized in a standard fashion   Anesthetic:  1% lidocaine w/ epinephrine 1-100,000 local infiltration Instrument used: #15 blade   Hemostasis achieved with: suture   Outcome: patient tolerated procedure well with no complications   Post-procedure details: sterile dressing applied and wound care instructions given   Dressing type: petrolatum    Specimen 1 - Surgical pathology Differential Diagnosis: cyst Check Margins: No Patient requests removal of epidermoid cyst mid back; she understands that this removal is done by her preference and there is a chance for recurrence or scar.  Lesion 1.5 cm;  1 cm ellipse with dissection of cyst intact except for fibrotic area superiorly which was dissected out.  Simple closure.  Pressure dressing.

## 2019-08-04 ENCOUNTER — Ambulatory Visit (INDEPENDENT_AMBULATORY_CARE_PROVIDER_SITE_OTHER): Payer: Medicare Other | Admitting: *Deleted

## 2019-08-04 ENCOUNTER — Telehealth: Payer: Self-pay | Admitting: *Deleted

## 2019-08-04 ENCOUNTER — Other Ambulatory Visit: Payer: Self-pay

## 2019-08-04 ENCOUNTER — Encounter: Payer: Self-pay | Admitting: *Deleted

## 2019-08-04 DIAGNOSIS — L729 Follicular cyst of the skin and subcutaneous tissue, unspecified: Secondary | ICD-10-CM

## 2019-08-04 NOTE — Telephone Encounter (Signed)
error 

## 2019-08-04 NOTE — Progress Notes (Signed)
NTS Suture removal, No signs or symptoms of infection, pathology to patient

## 2019-09-04 DIAGNOSIS — I10 Essential (primary) hypertension: Secondary | ICD-10-CM | POA: Diagnosis not present

## 2019-09-04 DIAGNOSIS — E7849 Other hyperlipidemia: Secondary | ICD-10-CM | POA: Diagnosis not present

## 2019-09-18 NOTE — Addendum Note (Signed)
Addended by: Lavonna Monarch on: 09/18/2019 11:40 AM   Modules accepted: Orders

## 2019-09-30 DIAGNOSIS — R000 Tachycardia, unspecified: Secondary | ICD-10-CM | POA: Diagnosis not present

## 2019-09-30 DIAGNOSIS — Z6826 Body mass index (BMI) 26.0-26.9, adult: Secondary | ICD-10-CM | POA: Diagnosis not present

## 2019-09-30 DIAGNOSIS — I10 Essential (primary) hypertension: Secondary | ICD-10-CM | POA: Diagnosis not present

## 2019-09-30 DIAGNOSIS — I700 Atherosclerosis of aorta: Secondary | ICD-10-CM | POA: Diagnosis not present

## 2019-10-15 DIAGNOSIS — M810 Age-related osteoporosis without current pathological fracture: Secondary | ICD-10-CM | POA: Diagnosis not present

## 2019-11-19 DIAGNOSIS — Z1231 Encounter for screening mammogram for malignant neoplasm of breast: Secondary | ICD-10-CM | POA: Diagnosis not present

## 2019-12-04 DIAGNOSIS — L03012 Cellulitis of left finger: Secondary | ICD-10-CM | POA: Diagnosis not present

## 2019-12-04 DIAGNOSIS — I700 Atherosclerosis of aorta: Secondary | ICD-10-CM | POA: Diagnosis not present

## 2019-12-04 DIAGNOSIS — I10 Essential (primary) hypertension: Secondary | ICD-10-CM | POA: Diagnosis not present

## 2019-12-04 DIAGNOSIS — E7849 Other hyperlipidemia: Secondary | ICD-10-CM | POA: Diagnosis not present

## 2019-12-04 DIAGNOSIS — Z87891 Personal history of nicotine dependence: Secondary | ICD-10-CM | POA: Diagnosis not present

## 2020-01-05 DIAGNOSIS — E041 Nontoxic single thyroid nodule: Secondary | ICD-10-CM | POA: Diagnosis not present

## 2020-01-05 DIAGNOSIS — Z87891 Personal history of nicotine dependence: Secondary | ICD-10-CM | POA: Diagnosis not present

## 2020-01-05 DIAGNOSIS — Z209 Contact with and (suspected) exposure to unspecified communicable disease: Secondary | ICD-10-CM | POA: Diagnosis not present

## 2020-01-05 DIAGNOSIS — E7849 Other hyperlipidemia: Secondary | ICD-10-CM | POA: Diagnosis not present

## 2020-01-05 DIAGNOSIS — I10 Essential (primary) hypertension: Secondary | ICD-10-CM | POA: Diagnosis not present

## 2020-01-05 DIAGNOSIS — Z720 Tobacco use: Secondary | ICD-10-CM | POA: Diagnosis not present

## 2020-01-05 DIAGNOSIS — I700 Atherosclerosis of aorta: Secondary | ICD-10-CM | POA: Diagnosis not present

## 2020-01-05 DIAGNOSIS — E559 Vitamin D deficiency, unspecified: Secondary | ICD-10-CM | POA: Diagnosis not present

## 2020-01-05 DIAGNOSIS — L03012 Cellulitis of left finger: Secondary | ICD-10-CM | POA: Diagnosis not present

## 2020-01-05 DIAGNOSIS — E782 Mixed hyperlipidemia: Secondary | ICD-10-CM | POA: Diagnosis not present

## 2020-01-07 DIAGNOSIS — H5201 Hypermetropia, right eye: Secondary | ICD-10-CM | POA: Diagnosis not present

## 2020-01-07 DIAGNOSIS — H52222 Regular astigmatism, left eye: Secondary | ICD-10-CM | POA: Diagnosis not present

## 2020-01-07 DIAGNOSIS — H524 Presbyopia: Secondary | ICD-10-CM | POA: Diagnosis not present

## 2020-01-07 DIAGNOSIS — H25813 Combined forms of age-related cataract, bilateral: Secondary | ICD-10-CM | POA: Diagnosis not present

## 2020-01-07 DIAGNOSIS — H5212 Myopia, left eye: Secondary | ICD-10-CM | POA: Diagnosis not present

## 2020-01-07 DIAGNOSIS — H52221 Regular astigmatism, right eye: Secondary | ICD-10-CM | POA: Diagnosis not present

## 2020-01-08 DIAGNOSIS — D72829 Elevated white blood cell count, unspecified: Secondary | ICD-10-CM | POA: Diagnosis not present

## 2020-01-08 DIAGNOSIS — E041 Nontoxic single thyroid nodule: Secondary | ICD-10-CM | POA: Diagnosis not present

## 2020-01-08 DIAGNOSIS — F411 Generalized anxiety disorder: Secondary | ICD-10-CM | POA: Diagnosis not present

## 2020-01-08 DIAGNOSIS — Z6825 Body mass index (BMI) 25.0-25.9, adult: Secondary | ICD-10-CM | POA: Diagnosis not present

## 2020-01-08 DIAGNOSIS — K635 Polyp of colon: Secondary | ICD-10-CM | POA: Diagnosis not present

## 2020-01-08 DIAGNOSIS — E782 Mixed hyperlipidemia: Secondary | ICD-10-CM | POA: Diagnosis not present

## 2020-01-08 DIAGNOSIS — Z23 Encounter for immunization: Secondary | ICD-10-CM | POA: Diagnosis not present

## 2020-01-08 DIAGNOSIS — I10 Essential (primary) hypertension: Secondary | ICD-10-CM | POA: Diagnosis not present

## 2020-02-04 DIAGNOSIS — L03012 Cellulitis of left finger: Secondary | ICD-10-CM | POA: Diagnosis not present

## 2020-02-04 DIAGNOSIS — E7849 Other hyperlipidemia: Secondary | ICD-10-CM | POA: Diagnosis not present

## 2020-02-04 DIAGNOSIS — I10 Essential (primary) hypertension: Secondary | ICD-10-CM | POA: Diagnosis not present

## 2020-02-04 DIAGNOSIS — I700 Atherosclerosis of aorta: Secondary | ICD-10-CM | POA: Diagnosis not present

## 2020-02-04 DIAGNOSIS — Z87891 Personal history of nicotine dependence: Secondary | ICD-10-CM | POA: Diagnosis not present

## 2020-03-05 DIAGNOSIS — L03012 Cellulitis of left finger: Secondary | ICD-10-CM | POA: Diagnosis not present

## 2020-03-05 DIAGNOSIS — E7849 Other hyperlipidemia: Secondary | ICD-10-CM | POA: Diagnosis not present

## 2020-03-05 DIAGNOSIS — I10 Essential (primary) hypertension: Secondary | ICD-10-CM | POA: Diagnosis not present

## 2020-03-09 DIAGNOSIS — Z6826 Body mass index (BMI) 26.0-26.9, adult: Secondary | ICD-10-CM | POA: Diagnosis not present

## 2020-03-09 DIAGNOSIS — M19072 Primary osteoarthritis, left ankle and foot: Secondary | ICD-10-CM | POA: Diagnosis not present

## 2020-03-25 DIAGNOSIS — Z23 Encounter for immunization: Secondary | ICD-10-CM | POA: Diagnosis not present

## 2020-04-05 DIAGNOSIS — I10 Essential (primary) hypertension: Secondary | ICD-10-CM | POA: Diagnosis not present

## 2020-04-05 DIAGNOSIS — L03012 Cellulitis of left finger: Secondary | ICD-10-CM | POA: Diagnosis not present

## 2020-04-05 DIAGNOSIS — E7849 Other hyperlipidemia: Secondary | ICD-10-CM | POA: Diagnosis not present

## 2020-05-06 DIAGNOSIS — I700 Atherosclerosis of aorta: Secondary | ICD-10-CM | POA: Diagnosis not present

## 2020-05-06 DIAGNOSIS — I10 Essential (primary) hypertension: Secondary | ICD-10-CM | POA: Diagnosis not present

## 2020-05-06 DIAGNOSIS — L03012 Cellulitis of left finger: Secondary | ICD-10-CM | POA: Diagnosis not present

## 2020-05-06 DIAGNOSIS — E7849 Other hyperlipidemia: Secondary | ICD-10-CM | POA: Diagnosis not present

## 2020-05-16 DIAGNOSIS — H2513 Age-related nuclear cataract, bilateral: Secondary | ICD-10-CM | POA: Diagnosis not present

## 2020-05-16 DIAGNOSIS — H43813 Vitreous degeneration, bilateral: Secondary | ICD-10-CM | POA: Diagnosis not present

## 2020-05-16 DIAGNOSIS — H25013 Cortical age-related cataract, bilateral: Secondary | ICD-10-CM | POA: Diagnosis not present

## 2020-06-04 DIAGNOSIS — I700 Atherosclerosis of aorta: Secondary | ICD-10-CM | POA: Diagnosis not present

## 2020-06-04 DIAGNOSIS — Z87891 Personal history of nicotine dependence: Secondary | ICD-10-CM | POA: Diagnosis not present

## 2020-06-04 DIAGNOSIS — E7849 Other hyperlipidemia: Secondary | ICD-10-CM | POA: Diagnosis not present

## 2020-06-04 DIAGNOSIS — L03012 Cellulitis of left finger: Secondary | ICD-10-CM | POA: Diagnosis not present

## 2020-06-04 DIAGNOSIS — I10 Essential (primary) hypertension: Secondary | ICD-10-CM | POA: Diagnosis not present

## 2020-06-08 DIAGNOSIS — Z01419 Encounter for gynecological examination (general) (routine) without abnormal findings: Secondary | ICD-10-CM | POA: Diagnosis not present

## 2020-06-08 DIAGNOSIS — Z0000 Encounter for general adult medical examination without abnormal findings: Secondary | ICD-10-CM | POA: Diagnosis not present

## 2020-06-08 DIAGNOSIS — Z124 Encounter for screening for malignant neoplasm of cervix: Secondary | ICD-10-CM | POA: Diagnosis not present

## 2020-07-06 ENCOUNTER — Encounter (INDEPENDENT_AMBULATORY_CARE_PROVIDER_SITE_OTHER): Payer: Self-pay | Admitting: *Deleted

## 2020-07-11 DIAGNOSIS — E781 Pure hyperglyceridemia: Secondary | ICD-10-CM | POA: Diagnosis not present

## 2020-07-11 DIAGNOSIS — I10 Essential (primary) hypertension: Secondary | ICD-10-CM | POA: Diagnosis not present

## 2020-07-11 DIAGNOSIS — E559 Vitamin D deficiency, unspecified: Secondary | ICD-10-CM | POA: Diagnosis not present

## 2020-07-14 DIAGNOSIS — Z0001 Encounter for general adult medical examination with abnormal findings: Secondary | ICD-10-CM | POA: Diagnosis not present

## 2020-07-14 DIAGNOSIS — F411 Generalized anxiety disorder: Secondary | ICD-10-CM | POA: Diagnosis not present

## 2020-07-14 DIAGNOSIS — I700 Atherosclerosis of aorta: Secondary | ICD-10-CM | POA: Diagnosis not present

## 2020-07-14 DIAGNOSIS — D72829 Elevated white blood cell count, unspecified: Secondary | ICD-10-CM | POA: Diagnosis not present

## 2020-07-14 DIAGNOSIS — I10 Essential (primary) hypertension: Secondary | ICD-10-CM | POA: Diagnosis not present

## 2020-07-14 DIAGNOSIS — Z6826 Body mass index (BMI) 26.0-26.9, adult: Secondary | ICD-10-CM | POA: Diagnosis not present

## 2020-07-14 DIAGNOSIS — K635 Polyp of colon: Secondary | ICD-10-CM | POA: Diagnosis not present

## 2020-07-14 DIAGNOSIS — E7849 Other hyperlipidemia: Secondary | ICD-10-CM | POA: Diagnosis not present

## 2020-07-22 DIAGNOSIS — Z87891 Personal history of nicotine dependence: Secondary | ICD-10-CM | POA: Diagnosis not present

## 2020-09-12 DIAGNOSIS — Z6826 Body mass index (BMI) 26.0-26.9, adult: Secondary | ICD-10-CM | POA: Diagnosis not present

## 2020-09-12 DIAGNOSIS — M26601 Right temporomandibular joint disorder, unspecified: Secondary | ICD-10-CM | POA: Diagnosis not present

## 2020-09-14 DIAGNOSIS — Z23 Encounter for immunization: Secondary | ICD-10-CM | POA: Diagnosis not present

## 2020-09-21 ENCOUNTER — Other Ambulatory Visit (INDEPENDENT_AMBULATORY_CARE_PROVIDER_SITE_OTHER): Payer: Self-pay

## 2020-09-21 ENCOUNTER — Telehealth (INDEPENDENT_AMBULATORY_CARE_PROVIDER_SITE_OTHER): Payer: Self-pay

## 2020-09-21 ENCOUNTER — Encounter (INDEPENDENT_AMBULATORY_CARE_PROVIDER_SITE_OTHER): Payer: Self-pay

## 2020-09-21 DIAGNOSIS — Z860100 Personal history of colon polyps, unspecified: Secondary | ICD-10-CM

## 2020-09-21 DIAGNOSIS — Z1211 Encounter for screening for malignant neoplasm of colon: Secondary | ICD-10-CM

## 2020-09-21 MED ORDER — PEG 3350-KCL-NA BICARB-NACL 420 G PO SOLR: 4000.0000 mL | ORAL | 0 refills | Status: DC

## 2020-09-21 NOTE — Telephone Encounter (Signed)
Referring MD/PCP: Burdine  Procedure: Tcs  Reason/Indication:  Screening, Hx of colon polyps  Has patient had this procedure before?  yes  If so, when, by whom and where?  2017   Is there a family history of colon cancer?  no  Who?  What age when diagnosed?    Is patient diabetic? If yes, Type 1 or Type 2   no      Does patient have prosthetic heart valve or mechanical valve?  no  Do you have a pacemaker/defibrillator?  no  Has patient ever had endocarditis/atrial fibrillation? no  Have you had a stroke/heart attack last 6 mths? no  Does patient use oxygen? no  Has patient had joint replacement within last 12 months?  no  Is patient constipated or do they take laxatives? no  Does patient have a history of alcohol/drug use?  no  Is patient on blood thinner such as Coumadin, Plavix and/or Aspirin? yes  Do you take medicine for weight loss?  no  For female patients,: do you still have your menstrual cycle? no  Medications: asa 81 mg daily, tylenol & ibuprofen prn, pravastatin 80mg  daily, amitriptyline 25 mg daily, premarin 0.3 mg daily, Vit D3 1000 iu daily, Vit B12 daily  Allergies: Sulfur  Medication Adjustment per Dr Rehman/Dr Jenetta Downer none  Procedure date & time: 10/11/20 12:50

## 2020-09-21 NOTE — Telephone Encounter (Signed)
Sarah Coffey, CMA  

## 2020-09-22 NOTE — Telephone Encounter (Signed)
Ok to schedule.  Thanks,  Esthela Brandner Castaneda Mayorga, MD Gastroenterology and Hepatology [facility]  

## 2020-10-03 DIAGNOSIS — Z87891 Personal history of nicotine dependence: Secondary | ICD-10-CM | POA: Diagnosis not present

## 2020-10-03 DIAGNOSIS — L03012 Cellulitis of left finger: Secondary | ICD-10-CM | POA: Diagnosis not present

## 2020-10-03 DIAGNOSIS — I700 Atherosclerosis of aorta: Secondary | ICD-10-CM | POA: Diagnosis not present

## 2020-10-03 DIAGNOSIS — E7849 Other hyperlipidemia: Secondary | ICD-10-CM | POA: Diagnosis not present

## 2020-10-03 DIAGNOSIS — I10 Essential (primary) hypertension: Secondary | ICD-10-CM | POA: Diagnosis not present

## 2020-10-10 ENCOUNTER — Other Ambulatory Visit (HOSPITAL_COMMUNITY): Admission: RE | Admit: 2020-10-10 | Payer: Medicare Other | Source: Ambulatory Visit

## 2020-10-11 ENCOUNTER — Ambulatory Visit (HOSPITAL_COMMUNITY)
Admission: RE | Admit: 2020-10-11 | Discharge: 2020-10-11 | Disposition: A | Discharge status: Home / Self Care | Payer: Medicare Other | Attending: Gastroenterology | Admitting: Gastroenterology

## 2020-10-11 ENCOUNTER — Ambulatory Visit (HOSPITAL_COMMUNITY): Payer: Medicare Other | Admitting: Anesthesiology

## 2020-10-11 ENCOUNTER — Other Ambulatory Visit: Payer: Self-pay

## 2020-10-11 ENCOUNTER — Encounter (HOSPITAL_COMMUNITY)
Admission: RE | Disposition: A | Discharge status: Home / Self Care | Payer: Self-pay | Source: Home / Self Care | Attending: Gastroenterology

## 2020-10-11 ENCOUNTER — Encounter (HOSPITAL_COMMUNITY): Payer: Self-pay | Admitting: Gastroenterology

## 2020-10-11 DIAGNOSIS — Z87891 Personal history of nicotine dependence: Secondary | ICD-10-CM | POA: Insufficient documentation

## 2020-10-11 DIAGNOSIS — D122 Benign neoplasm of ascending colon: Secondary | ICD-10-CM | POA: Diagnosis not present

## 2020-10-11 DIAGNOSIS — Z79890 Hormone replacement therapy (postmenopausal): Secondary | ICD-10-CM | POA: Insufficient documentation

## 2020-10-11 DIAGNOSIS — F419 Anxiety disorder, unspecified: Secondary | ICD-10-CM | POA: Diagnosis not present

## 2020-10-11 DIAGNOSIS — Z1211 Encounter for screening for malignant neoplasm of colon: Secondary | ICD-10-CM | POA: Diagnosis not present

## 2020-10-11 DIAGNOSIS — Z79899 Other long term (current) drug therapy: Secondary | ICD-10-CM | POA: Diagnosis not present

## 2020-10-11 DIAGNOSIS — Z09 Encounter for follow-up examination after completed treatment for conditions other than malignant neoplasm: Secondary | ICD-10-CM | POA: Diagnosis not present

## 2020-10-11 DIAGNOSIS — Z882 Allergy status to sulfonamides status: Secondary | ICD-10-CM | POA: Diagnosis not present

## 2020-10-11 DIAGNOSIS — Z86010 Personal history of colonic polyps: Secondary | ICD-10-CM

## 2020-10-11 DIAGNOSIS — K635 Polyp of colon: Secondary | ICD-10-CM | POA: Diagnosis not present

## 2020-10-11 DIAGNOSIS — Z7982 Long term (current) use of aspirin: Secondary | ICD-10-CM | POA: Insufficient documentation

## 2020-10-11 HISTORY — PX: POLYPECTOMY: SHX5525

## 2020-10-11 HISTORY — PX: COLONOSCOPY WITH PROPOFOL: SHX5780

## 2020-10-11 HISTORY — DX: Anxiety disorder, unspecified: F41.9

## 2020-10-11 LAB — HM COLONOSCOPY

## 2020-10-11 SURGERY — COLONOSCOPY WITH PROPOFOL
Anesthesia: General

## 2020-10-11 MED ORDER — PROPOFOL 10 MG/ML IV BOLUS
INTRAVENOUS | Status: DC | PRN
  Administered 2020-10-11: 100 mg via INTRAVENOUS

## 2020-10-11 MED ORDER — PROPOFOL 500 MG/50ML IV EMUL
INTRAVENOUS | Status: DC | PRN
  Administered 2020-10-11: 150 ug/kg/min via INTRAVENOUS

## 2020-10-11 MED ORDER — STERILE WATER FOR IRRIGATION IR SOLN
Status: DC | PRN
  Administered 2020-10-11: 1.5 mL

## 2020-10-11 MED ORDER — LACTATED RINGERS IV SOLN: INTRAVENOUS | Status: DC

## 2020-10-11 MED ORDER — LIDOCAINE HCL (CARDIAC) PF 100 MG/5ML IV SOSY
PREFILLED_SYRINGE | INTRAVENOUS | Status: DC | PRN
  Administered 2020-10-11: 50 mg via INTRAVENOUS

## 2020-10-11 NOTE — Anesthesia Preprocedure Evaluation (Signed)
Anesthesia Evaluation  Patient identified by MRN, date of birth, ID band Patient awake    Reviewed: Allergy & Precautions, NPO status , Patient's Chart, lab work & pertinent test results  History of Anesthesia Complications Negative for: history of anesthetic complications  Airway Mallampati: II  TM Distance: >3 FB Neck ROM: Full    Dental  (+) Dental Advisory Given, Missing   Pulmonary former smoker,    Pulmonary exam normal breath sounds clear to auscultation       Cardiovascular Exercise Tolerance: Good Normal cardiovascular exam Rhythm:Regular Rate:Normal     Neuro/Psych Anxiety    GI/Hepatic negative GI ROS, Neg liver ROS,   Endo/Other  negative endocrine ROS  Renal/GU negative Renal ROS     Musculoskeletal negative musculoskeletal ROS (+)   Abdominal   Peds  Hematology negative hematology ROS (+)   Anesthesia Other Findings   Reproductive/Obstetrics negative OB ROS                             Anesthesia Physical Anesthesia Plan  ASA: II  Anesthesia Plan: General   Post-op Pain Management:    Induction: Intravenous  PONV Risk Score and Plan: Propofol infusion  Airway Management Planned: Nasal Cannula and Natural Airway  Additional Equipment:   Intra-op Plan:   Post-operative Plan:   Informed Consent: I have reviewed the patients History and Physical, chart, labs and discussed the procedure including the risks, benefits and alternatives for the proposed anesthesia with the patient or authorized representative who has indicated his/her understanding and acceptance.     Dental advisory given  Plan Discussed with: CRNA and Surgeon  Anesthesia Plan Comments:         Anesthesia Quick Evaluation

## 2020-10-11 NOTE — Transfer of Care (Signed)
Immediate Anesthesia Transfer of Care Note  Patient: Sarah Coffey  Procedure(s) Performed: COLONOSCOPY WITH PROPOFOL (N/A ) POLYPECTOMY  Patient Location: PACU  Anesthesia Type:General  Level of Consciousness: awake, alert , oriented and patient cooperative  Airway & Oxygen Therapy: Patient Spontanous Breathing  Post-op Assessment: Report given to RN, Post -op Vital signs reviewed and stable and Patient moving all extremities X 4  Post vital signs: Reviewed and stable  Last Vitals:  Vitals Value Taken Time  BP    Temp    Pulse    Resp    SpO2      Last Pain:  Vitals:   10/11/20 1132  TempSrc:   PainSc: 0-No pain         Complications: No complications documented.

## 2020-10-11 NOTE — Op Note (Signed)
[facility] Patient Name: Sarah Coffey Procedure Date: 10/11/2020 11:14 AM MRN: [id number] Date of Birth: [birth year] Attending MD: Maylon Peppers ,  CSN: [id number] Age: 75 Admit Type: Outpatient Procedure:                Colonoscopy Indications:              High risk colon cancer surveillance: Personal                            history of colonic polyps Providers:                Maylon Peppers, Caprice Kluver, [place] Risa Grill, Technician Referring MD:              Medicines:                Monitored Anesthesia Care Complications:            No immediate complications. Estimated Blood Loss:     Estimated blood loss: none. Procedure:                Pre-Anesthesia Assessment:                           - Prior to the procedure, a History and Physical                            was performed, and patient medications, allergies                            and sensitivities were reviewed. The patient's                            tolerance of previous anesthesia was reviewed.                           - The risks and benefits of the procedure and the                            sedation options and risks were discussed with the                            patient. All questions were answered and informed                            consent was obtained.                           - ASA Grade Assessment: II - A patient with mild                            systemic disease.                           After obtaining informed consent, the colonoscope  was passed under direct vision. Throughout the                            procedure, the patient's blood pressure, pulse, and                            oxygen saturations were monitored continuously. The                            [id number]) was introduced through the anus                            and advanced to the the cecum, identified by                             appendiceal orifice and ileocecal valve. The                            colonoscopy was performed without difficulty. The                            patient tolerated the procedure well. The quality                            of the bowel preparation was excellent. Scope In: 11:37:17 AM Scope Out: 11:53:41 AM Scope Withdrawal Time: 0 hours 13 minutes 44 seconds  Total Procedure Duration: 0 hours 16 minutes 24 seconds  Findings:      The perianal and digital rectal examinations were normal.      A 8 mm polyp was found in the ascending colon. The polyp was sessile.       The polyp was removed with a cold snare. Resection and retrieval were       complete.      The retroflexed view of the distal rectum and anal verge was normal and       showed no anal or rectal abnormalities. Impression:               - One 8 mm polyp in the ascending colon, removed                            with a cold snare. Resected and retrieved.                           - The distal rectum and anal verge are normal on                            retroflexion view. Moderate Sedation:      Per Anesthesia Care Recommendation:           - Discharge patient to home (ambulatory).                           - Resume previous diet.                           -  Await pathology results.                           - Repeat colonoscopy for surveillance based on                            pathology results. Procedure Code(s):        --- Professional ---                           [id number], Colonoscopy, flexible; with removal of                            tumor(s), polyp(s), or other lesion(s) by snare                            technique Diagnosis Code(s):        --- Professional ---                           Z86.010, Personal history of colonic polyps                           K63.5, Polyp of colon CPT copyright 2019 American Medical Association. All rights reserved. The codes documented in this report are preliminary and upon  coder review may  be revised to meet current compliance requirements. Maylon Peppers, MD Maylon Peppers,  10/11/2020 11:56:54 AM This report has been signed electronically. Number of Addenda: 0

## 2020-10-11 NOTE — Anesthesia Postprocedure Evaluation (Signed)
Anesthesia Post Note  Patient: Sarah Coffey  Procedure(s) Performed: COLONOSCOPY WITH PROPOFOL (N/A ) POLYPECTOMY  Patient location during evaluation: Phase II Anesthesia Type: General Level of consciousness: awake, oriented and patient cooperative Pain management: pain level controlled Vital Signs Assessment: post-procedure vital signs reviewed and stable Respiratory status: spontaneous breathing Cardiovascular status: stable Postop Assessment: no apparent nausea or vomiting Anesthetic complications: no   No complications documented.   Last Vitals:  Vitals:   10/11/20 1101  BP: (!) 142/70  Resp: 15  Temp: 36.7 C  SpO2: 96%    Last Pain:  Vitals:   10/11/20 1132  TempSrc:   PainSc: 0-No pain                 [name]

## 2020-10-11 NOTE — Discharge Instructions (Signed)
You are being discharged to home.  Resume your previous diet.  We are waiting for your pathology results.  Your physician has recommended a repeat colonoscopy for surveillance based on pathology results.   Colonoscopy, Adult, Care After This sheet gives you information about how to care for yourself after your procedure. Your doctor may also give you more specific instructions. If you have problems or questions, call your doctor. What can I expect after the procedure? After the procedure, it is common to have:  A small amount of blood in your poop (stool) for 24 hours.  Some gas.  Mild cramping or bloating in your belly (abdomen). Follow these instructions at home: Eating and drinking  Drink enough fluid to keep your pee (urine) pale yellow.  Follow instructions from your doctor about what you cannot eat or drink.  Return to your normal diet as told by your doctor. Avoid heavy or fried foods that are hard to digest.   Activity  Rest as told by your doctor.  Do not sit for a long time without moving. Get up to take short walks every 1-2 hours. This is important. Ask for help if you feel weak or unsteady.  Return to your normal activities as told by your doctor. Ask your doctor what activities are safe for you. To help cramping and bloating:  Try walking around.  Put heat on your belly as told by your doctor. Use the heat source that your doctor recommends, such as a moist heat pack or a heating pad. ? Put a towel between your skin and the heat source. ? Leave the heat on for 20-30 minutes. ? Remove the heat if your skin turns bright red. This is very important if you are unable to feel pain, heat, or cold. You may have a greater risk of getting burned.   General instructions  If you were given a medicine to help you relax (sedative) during your procedure, it can affect you for many hours. Do not drive or use machinery until your doctor says that it is safe.  For the first 24  hours after the procedure: ? Do not sign important documents. ? Do not drink alcohol. ? Do your daily activities more slowly than normal. ? Eat foods that are soft and easy to digest.  Take over-the-counter or prescription medicines only as told by your doctor.  Keep all follow-up visits as told by your doctor. This is important. Contact a doctor if:  You have blood in your poop 2-3 days after the procedure. Get help right away if:  You have more than a small amount of blood in your poop.  You see large clumps of tissue (blood clots) in your poop.  Your belly is swollen.  You feel like you may vomit (nauseous).  You vomit.  You have a fever.  You have belly pain that gets worse, and medicine does not help your pain. Summary  After the procedure, it is common to have a small amount of blood in your poop. You may also have mild cramping and bloating in your belly.  If you were given a medicine to help you relax (sedative) during your procedure, it can affect you for many hours. Do not drive or use machinery until your doctor says that it is safe.  Get help right away if you have a lot of blood in your poop, feel like you may vomit, have a fever, or have more belly pain. This information is not intended  to replace advice given to you by your health care provider. Make sure you discuss any questions you have with your health care provider. Document Revised: 02/27/2019 Document Reviewed: 11/17/2018 Elsevier Patient Education  [place].  Colon Polyps  Colon polyps are tissue growths inside the colon, which is part of the large intestine. They are one of the types of polyps that can grow in the body. A polyp may be a round bump or a mushroom-shaped growth. You could have one polyp or more than one. Most colon polyps are noncancerous (benign). However, some colon polyps can become cancerous over time. Finding and removing the polyps early can help prevent this. What are  the causes? The exact cause of colon polyps is not known. What increases the risk? The following factors may make you more likely to develop this condition:  Having a family history of colorectal cancer or colon polyps.  Being older than 76 years of age.  Being younger than 76 years of age and having a significant family history of colorectal cancer or colon polyps or a genetic condition that puts you at higher risk of getting colon polyps.  Having inflammatory bowel disease, such as ulcerative colitis or Crohn's disease.  Having certain conditions passed from parent to child (hereditary conditions), such as: ? Familial adenomatous polyposis (FAP). ? Lynch syndrome. ? Turcot syndrome. ? Peutz-Jeghers syndrome. ? MUTYH-associated polyposis (MAP).  Being overweight.  Certain lifestyle factors. These include smoking cigarettes, drinking too much alcohol, not getting enough exercise, and eating a diet that is high in fat and red meat and low in fiber.  Having had childhood cancer that was treated with radiation of the abdomen. What are the signs or symptoms? Many times, there are no symptoms. If you have symptoms, they may include:  Blood coming from the rectum during a bowel movement.  Blood in the stool (feces). The blood may be bright red or very dark in color.  Pain in the abdomen.  A change in bowel habits, such as constipation or diarrhea. How is this diagnosed? This condition is diagnosed with a colonoscopy. This is a procedure in which a lighted, flexible scope is inserted into the opening between the buttocks (anus) and then passed into the colon to examine the area. Polyps are sometimes found when a colonoscopy is done as part of routine cancer screening tests. How is this treated? This condition is treated by removing any polyps that are found. Most polyps can be removed during a colonoscopy. Those polyps will then be tested for cancer. Additional treatment may be needed  depending on the results of testing. Follow these instructions at home: Eating and drinking  Eat foods that are high in fiber, such as fruits, vegetables, and whole grains.  Eat foods that are high in calcium and vitamin D, such as milk, cheese, yogurt, eggs, liver, fish, and broccoli.  Limit foods that are high in fat, such as fried foods and desserts.  Limit the amount of red meat, precooked or cured meat, or other processed meat that you eat, such as hot dogs, sausages, bacon, or meat loaves.  Limit sugary drinks.   Lifestyle  Maintain a healthy weight, or lose weight if recommended by your health care provider.  Exercise every day or as told by your health care provider.  Do not use any products that contain nicotine or tobacco, such as cigarettes, e-cigarettes, and chewing tobacco. If you need help quitting, ask your health care provider.  Do not  drink alcohol if: ? Your health care provider tells you not to drink. ? You are pregnant, may be pregnant, or are planning to become pregnant.  If you drink alcohol: ? Limit how much you use to:  0-1 drink a day for women.  0-2 drinks a day for men. ? Know how much alcohol is in your drink. In the [place] one drink equals one 12 oz bottle of beer (355 mL), one 5 oz glass of wine (148 mL), or one 1 oz glass of hard liquor (44 mL). General instructions  Take over-the-counter and prescription medicines only as told by your health care provider.  Keep all follow-up visits. This is important. This includes having regularly scheduled colonoscopies. Talk to your health care provider about when you need a colonoscopy. Contact a health care provider if:  You have new or worsening bleeding during a bowel movement.  You have new or increased blood in your stool.  You have a change in bowel habits.  You lose weight for no known reason. Summary  Colon polyps are tissue growths inside the colon, which is part of the large intestine.  They are one type of polyp that can grow in the body.  Most colon polyps are noncancerous (benign), but some can become cancerous over time.  This condition is diagnosed with a colonoscopy.  This condition is treated by removing any polyps that are found. Most polyps can be removed during a colonoscopy. This information is not intended to replace advice given to you by your health care provider. Make sure you discuss any questions you have with your health care provider. Document Revised: 08/12/2019 Document Reviewed: 08/12/2019 Elsevier Patient Education  2021 [employer].

## 2020-10-11 NOTE — H&P (Signed)
Sarah Coffey is an 75 y.o. female.   Chief Complaint: History of colon polyps HPI: 75 year old female with past medical history of anxiety, coming for screening colonoscopy due to presence of colon polyps in previous colonoscopy.  Last colonoscopy was performed 5 years ago, she reports that she had some polyps removed.  The patient denies having any complaints such as melena, hematochezia, abdominal pain or distention, change in her bowel movement consistency or frequency, no changes in her weight recently.  No family history of colorectal cancer.    Past Medical History:  Diagnosis Date  . Anxiety     Past Surgical History:  Procedure Laterality Date  . ABDOMINAL HYSTERECTOMY    . CYST EXCISION     tale bone  . TONSILLECTOMY      History reviewed. No pertinent family history. Social History:  reports that she has quit smoking. She has quit using smokeless tobacco. No history on file for alcohol use and drug use.  Allergies:  Allergies  Allergen Reactions  . Sulfa Antibiotics     unknown    Medications Prior to Admission  Medication Sig Dispense Refill  . acetaminophen (TYLENOL) 500 MG tablet Take 500-1,000 mg by mouth every 6 (six) hours as needed (pain).    Marland Kitchen amitriptyline (ELAVIL) 25 MG tablet Take 25 mg by mouth at bedtime.    Marland Kitchen aspirin 81 MG EC tablet Take 81 mg by mouth at bedtime.    . busPIRone (BUSPAR) 15 MG tablet Take 7.5 mg by mouth 2 (two) times daily as needed (anxiety).    . cholecalciferol (VITAMIN D3) 25 MCG (1000 UNIT) tablet Take 1,000 Units by mouth at bedtime.    . Coenzyme Q10 (COQ10) 200 MG CAPS Take 200 mg by mouth at bedtime.    . DHA-EPA-Vitamin E (OMEGA-3 COMPLEX PO) Take 1 capsule by mouth at bedtime. Omega XL    . estrogens, conjugated, (PREMARIN) 0.3 MG tablet Take 0.3 mg by mouth at bedtime.    Marland Kitchen ibuprofen (ADVIL) 200 MG tablet Take 200-400 mg by mouth every 8 (eight) hours as needed (pain.).    Marland Kitchen polyethylene glycol-electrolytes (TRILYTE)  420 g solution Take 4,000 mLs by mouth as directed. 4000 mL 0  . pravastatin (PRAVACHOL) 80 MG tablet Take 80 mg by mouth at bedtime.    . vitamin B-12 (CYANOCOBALAMIN) 1000 MCG tablet Take 1,000 mcg by mouth daily.    . Multiple Vitamin (MULTIVITAMIN WITH MINERALS) TABS tablet Take 1 tablet by mouth at bedtime. Centrum Silver for Women 50+      No results found for this or any previous visit (from the past 48 hour(s)). No results found.  Review of Systems  Constitutional: Negative.   HENT: Negative.   Eyes: Negative.   Respiratory: Negative.   Cardiovascular: Negative.   Gastrointestinal: Negative.   Endocrine: Negative.   Genitourinary: Negative.   Musculoskeletal: Negative.   Skin: Negative.   Allergic/Immunologic: Negative.   Neurological: Negative.   Hematological: Negative.   Psychiatric/Behavioral: Negative.     Blood pressure (!) 142/70, temperature 98.1 F (36.7 C), temperature source Oral, resp. rate 15, height 5' 3.5" (1.613 m), weight 65.8 kg, SpO2 96 %. Physical Exam  GENERAL: The patient is AO x3, in no acute distress. HEENT: Head is normocephalic and atraumatic. EOMI are intact. Mouth is well hydrated and without lesions. NECK: Supple. No masses LUNGS: Clear to auscultation. No presence of rhonchi/wheezing/rales. Adequate chest expansion HEART: RRR, normal s1 and s2. ABDOMEN: Soft, nontender, no guarding,  no peritoneal signs, and nondistended. BS +. No masses. EXTREMITIES: Without any cyanosis, clubbing, rash, lesions or edema. NEUROLOGIC: AOx3, no focal motor deficit. SKIN: no jaundice, no rashes  Assessment/Plan  75 year old female with past medical history of anxiety, coming for screening colonoscopy due to presence of colon polyps in previous colonoscopy.  We will proceed with colonoscopy.  Harvel Quale, MD 10/11/2020, 11:24 AM

## 2020-10-12 LAB — SURGICAL PATHOLOGY

## 2020-10-13 ENCOUNTER — Encounter (INDEPENDENT_AMBULATORY_CARE_PROVIDER_SITE_OTHER): Payer: Self-pay | Admitting: *Deleted

## 2020-10-19 ENCOUNTER — Encounter (HOSPITAL_COMMUNITY): Payer: Self-pay | Admitting: Gastroenterology

## 2020-11-03 DIAGNOSIS — E7849 Other hyperlipidemia: Secondary | ICD-10-CM | POA: Diagnosis not present

## 2020-11-03 DIAGNOSIS — L03012 Cellulitis of left finger: Secondary | ICD-10-CM | POA: Diagnosis not present

## 2020-11-03 DIAGNOSIS — I10 Essential (primary) hypertension: Secondary | ICD-10-CM | POA: Diagnosis not present

## 2020-11-03 DIAGNOSIS — I700 Atherosclerosis of aorta: Secondary | ICD-10-CM | POA: Diagnosis not present

## 2020-11-03 DIAGNOSIS — Z87891 Personal history of nicotine dependence: Secondary | ICD-10-CM | POA: Diagnosis not present

## 2020-12-04 DIAGNOSIS — L03012 Cellulitis of left finger: Secondary | ICD-10-CM | POA: Diagnosis not present

## 2020-12-04 DIAGNOSIS — Z87891 Personal history of nicotine dependence: Secondary | ICD-10-CM | POA: Diagnosis not present

## 2020-12-04 DIAGNOSIS — I10 Essential (primary) hypertension: Secondary | ICD-10-CM | POA: Diagnosis not present

## 2020-12-04 DIAGNOSIS — E7849 Other hyperlipidemia: Secondary | ICD-10-CM | POA: Diagnosis not present

## 2020-12-04 DIAGNOSIS — I700 Atherosclerosis of aorta: Secondary | ICD-10-CM | POA: Diagnosis not present

## 2021-01-04 DIAGNOSIS — E7849 Other hyperlipidemia: Secondary | ICD-10-CM | POA: Diagnosis not present

## 2021-01-04 DIAGNOSIS — I10 Essential (primary) hypertension: Secondary | ICD-10-CM | POA: Diagnosis not present

## 2021-01-04 DIAGNOSIS — Z87891 Personal history of nicotine dependence: Secondary | ICD-10-CM | POA: Diagnosis not present

## 2021-01-04 DIAGNOSIS — L03012 Cellulitis of left finger: Secondary | ICD-10-CM | POA: Diagnosis not present

## 2021-01-04 DIAGNOSIS — I700 Atherosclerosis of aorta: Secondary | ICD-10-CM | POA: Diagnosis not present

## 2021-01-16 DIAGNOSIS — E559 Vitamin D deficiency, unspecified: Secondary | ICD-10-CM | POA: Diagnosis not present

## 2021-01-16 DIAGNOSIS — I10 Essential (primary) hypertension: Secondary | ICD-10-CM | POA: Diagnosis not present

## 2021-01-16 DIAGNOSIS — E781 Pure hyperglyceridemia: Secondary | ICD-10-CM | POA: Diagnosis not present

## 2021-01-19 DIAGNOSIS — D72829 Elevated white blood cell count, unspecified: Secondary | ICD-10-CM | POA: Diagnosis not present

## 2021-01-19 DIAGNOSIS — I10 Essential (primary) hypertension: Secondary | ICD-10-CM | POA: Diagnosis not present

## 2021-01-19 DIAGNOSIS — Z1389 Encounter for screening for other disorder: Secondary | ICD-10-CM | POA: Diagnosis not present

## 2021-01-19 DIAGNOSIS — Z87891 Personal history of nicotine dependence: Secondary | ICD-10-CM | POA: Diagnosis not present

## 2021-01-19 DIAGNOSIS — Z1331 Encounter for screening for depression: Secondary | ICD-10-CM | POA: Diagnosis not present

## 2021-01-19 DIAGNOSIS — E7849 Other hyperlipidemia: Secondary | ICD-10-CM | POA: Diagnosis not present

## 2021-01-19 DIAGNOSIS — I700 Atherosclerosis of aorta: Secondary | ICD-10-CM | POA: Diagnosis not present

## 2021-01-19 DIAGNOSIS — K635 Polyp of colon: Secondary | ICD-10-CM | POA: Diagnosis not present

## 2021-03-06 DIAGNOSIS — I10 Essential (primary) hypertension: Secondary | ICD-10-CM | POA: Diagnosis not present

## 2021-03-06 DIAGNOSIS — I700 Atherosclerosis of aorta: Secondary | ICD-10-CM | POA: Diagnosis not present

## 2021-03-06 DIAGNOSIS — E7849 Other hyperlipidemia: Secondary | ICD-10-CM | POA: Diagnosis not present

## 2021-03-06 DIAGNOSIS — Z87891 Personal history of nicotine dependence: Secondary | ICD-10-CM | POA: Diagnosis not present

## 2021-03-06 DIAGNOSIS — L03012 Cellulitis of left finger: Secondary | ICD-10-CM | POA: Diagnosis not present

## 2021-04-05 DIAGNOSIS — E7849 Other hyperlipidemia: Secondary | ICD-10-CM | POA: Diagnosis not present

## 2021-04-05 DIAGNOSIS — L03012 Cellulitis of left finger: Secondary | ICD-10-CM | POA: Diagnosis not present

## 2021-04-05 DIAGNOSIS — I700 Atherosclerosis of aorta: Secondary | ICD-10-CM | POA: Diagnosis not present

## 2021-04-05 DIAGNOSIS — I10 Essential (primary) hypertension: Secondary | ICD-10-CM | POA: Diagnosis not present

## 2021-05-05 DIAGNOSIS — Z87891 Personal history of nicotine dependence: Secondary | ICD-10-CM | POA: Diagnosis not present

## 2021-05-05 DIAGNOSIS — I700 Atherosclerosis of aorta: Secondary | ICD-10-CM | POA: Diagnosis not present

## 2021-05-05 DIAGNOSIS — I10 Essential (primary) hypertension: Secondary | ICD-10-CM | POA: Diagnosis not present

## 2021-05-05 DIAGNOSIS — L03012 Cellulitis of left finger: Secondary | ICD-10-CM | POA: Diagnosis not present

## 2021-05-05 DIAGNOSIS — E7849 Other hyperlipidemia: Secondary | ICD-10-CM | POA: Diagnosis not present

## 2021-06-04 DIAGNOSIS — I10 Essential (primary) hypertension: Secondary | ICD-10-CM | POA: Diagnosis not present

## 2021-06-04 DIAGNOSIS — E7849 Other hyperlipidemia: Secondary | ICD-10-CM | POA: Diagnosis not present

## 2021-06-07 DIAGNOSIS — H52203 Unspecified astigmatism, bilateral: Secondary | ICD-10-CM | POA: Diagnosis not present

## 2021-06-07 DIAGNOSIS — H04123 Dry eye syndrome of bilateral lacrimal glands: Secondary | ICD-10-CM | POA: Diagnosis not present

## 2021-06-07 DIAGNOSIS — H43813 Vitreous degeneration, bilateral: Secondary | ICD-10-CM | POA: Diagnosis not present

## 2021-06-07 DIAGNOSIS — H2513 Age-related nuclear cataract, bilateral: Secondary | ICD-10-CM | POA: Diagnosis not present

## 2021-07-17 DIAGNOSIS — Z13220 Encounter for screening for lipoid disorders: Secondary | ICD-10-CM | POA: Diagnosis not present

## 2021-07-17 DIAGNOSIS — E782 Mixed hyperlipidemia: Secondary | ICD-10-CM | POA: Diagnosis not present

## 2021-07-17 DIAGNOSIS — Z1329 Encounter for screening for other suspected endocrine disorder: Secondary | ICD-10-CM | POA: Diagnosis not present

## 2021-07-17 DIAGNOSIS — E7849 Other hyperlipidemia: Secondary | ICD-10-CM | POA: Diagnosis not present

## 2021-07-17 DIAGNOSIS — D72829 Elevated white blood cell count, unspecified: Secondary | ICD-10-CM | POA: Diagnosis not present

## 2021-07-17 DIAGNOSIS — I10 Essential (primary) hypertension: Secondary | ICD-10-CM | POA: Diagnosis not present

## 2021-07-18 ENCOUNTER — Ambulatory Visit: Payer: Medicare Other | Admitting: Dermatology

## 2021-08-09 ENCOUNTER — Ambulatory Visit: Payer: Medicare Other | Admitting: Dermatology

## 2021-08-10 DIAGNOSIS — Z87891 Personal history of nicotine dependence: Secondary | ICD-10-CM | POA: Diagnosis not present

## 2021-08-10 DIAGNOSIS — I700 Atherosclerosis of aorta: Secondary | ICD-10-CM | POA: Diagnosis not present

## 2021-08-10 DIAGNOSIS — Z122 Encounter for screening for malignant neoplasm of respiratory organs: Secondary | ICD-10-CM | POA: Diagnosis not present

## 2021-08-10 DIAGNOSIS — J439 Emphysema, unspecified: Secondary | ICD-10-CM | POA: Diagnosis not present

## 2021-08-28 ENCOUNTER — Encounter: Payer: Self-pay | Admitting: Dermatology

## 2021-08-28 ENCOUNTER — Ambulatory Visit (INDEPENDENT_AMBULATORY_CARE_PROVIDER_SITE_OTHER): Payer: Medicare Other | Admitting: Dermatology

## 2021-08-28 DIAGNOSIS — D485 Neoplasm of uncertain behavior of skin: Secondary | ICD-10-CM

## 2021-08-28 DIAGNOSIS — Z1283 Encounter for screening for malignant neoplasm of skin: Secondary | ICD-10-CM

## 2021-08-28 DIAGNOSIS — L820 Inflamed seborrheic keratosis: Secondary | ICD-10-CM | POA: Diagnosis not present

## 2021-08-28 NOTE — Patient Instructions (Signed)

## 2021-09-03 DIAGNOSIS — I10 Essential (primary) hypertension: Secondary | ICD-10-CM | POA: Diagnosis not present

## 2021-09-03 DIAGNOSIS — E7849 Other hyperlipidemia: Secondary | ICD-10-CM | POA: Diagnosis not present

## 2021-09-15 ENCOUNTER — Encounter: Payer: Self-pay | Admitting: Dermatology

## 2021-09-15 NOTE — Progress Notes (Signed)
? ?  Follow-Up Visit ?  ?Subjective  ?Sarah Coffey is a 76 y.o. female who presents for the following: Skin Problem (New lesion on right arm x months- growing ). ? ?New spot on, check tick bite and several other spots. ?Location:  ?Duration:  ?Quality:  ?Associated Signs/Symptoms: ?Modifying Factors:  ?Severity:  ?Timing: ?Context:  ? ?Objective  ?Well appearing patient in no apparent distress; mood and affect are within normal limits. ?No atypical pigmented lesions (all checked with dermoscopy) noted at the time of the visit.  1 possible nonmelanoma skin cancer arm will be biopsied.  Tick bite on the thigh itches from time to time (discussed the concept of tick bite granuloma). ? ?Right Forearm - Posterior ?1 cm waxy pink crust, dermoscopy favors I SK over SCCA. ? ? ? ? ? ? ?All sun exposed areas plus back examined. ? ? ?Assessment & Plan  ? ? ?Screening exam for skin cancer ? ?May call In triamcinolone 0.1% for tick bite if needed in the future. Keep yearly follow ups. ? ?Neoplasm of uncertain behavior of skin ?Right Forearm - Posterior ? ?Skin / nail biopsy ?Type of biopsy: tangential   ?Informed consent: discussed and consent obtained   ?Timeout: patient name, date of birth, surgical site, and procedure verified   ?Anesthesia: the lesion was anesthetized in a standard fashion   ?Anesthetic:  1% lidocaine w/ epinephrine 1-100,000 local infiltration ?Instrument used: flexible razor blade   ?Hemostasis achieved with: aluminum chloride and electrodesiccation   ?Outcome: patient tolerated procedure well   ?Post-procedure details: wound care instructions given   ? ?Specimen 1 - Surgical pathology ?Differential Diagnosis: isk cautery only ? ?Check Margins: No ? ? ? ? ? ?I, Lavonna Monarch, MD, have reviewed all documentation for this visit.  The documentation on 09/15/21 for the exam, diagnosis, procedures, and orders are all accurate and complete. ?

## 2021-10-26 DIAGNOSIS — F411 Generalized anxiety disorder: Secondary | ICD-10-CM | POA: Diagnosis not present

## 2021-10-26 DIAGNOSIS — K635 Polyp of colon: Secondary | ICD-10-CM | POA: Diagnosis not present

## 2021-10-26 DIAGNOSIS — Z0001 Encounter for general adult medical examination with abnormal findings: Secondary | ICD-10-CM | POA: Diagnosis not present

## 2021-10-26 DIAGNOSIS — E7849 Other hyperlipidemia: Secondary | ICD-10-CM | POA: Diagnosis not present

## 2021-10-26 DIAGNOSIS — Z6825 Body mass index (BMI) 25.0-25.9, adult: Secondary | ICD-10-CM | POA: Diagnosis not present

## 2021-10-26 DIAGNOSIS — Z87891 Personal history of nicotine dependence: Secondary | ICD-10-CM | POA: Diagnosis not present

## 2021-10-26 DIAGNOSIS — M8580 Other specified disorders of bone density and structure, unspecified site: Secondary | ICD-10-CM | POA: Diagnosis not present

## 2021-10-26 DIAGNOSIS — I10 Essential (primary) hypertension: Secondary | ICD-10-CM | POA: Diagnosis not present

## 2021-10-26 DIAGNOSIS — D72829 Elevated white blood cell count, unspecified: Secondary | ICD-10-CM | POA: Diagnosis not present

## 2021-10-26 DIAGNOSIS — I700 Atherosclerosis of aorta: Secondary | ICD-10-CM | POA: Diagnosis not present

## 2021-12-12 DIAGNOSIS — R140 Abdominal distension (gaseous): Secondary | ICD-10-CM | POA: Diagnosis not present

## 2022-01-24 DIAGNOSIS — Z23 Encounter for immunization: Secondary | ICD-10-CM | POA: Diagnosis not present

## 2022-02-21 DIAGNOSIS — M8589 Other specified disorders of bone density and structure, multiple sites: Secondary | ICD-10-CM | POA: Diagnosis not present

## 2022-02-21 DIAGNOSIS — Z13820 Encounter for screening for osteoporosis: Secondary | ICD-10-CM | POA: Diagnosis not present

## 2022-02-21 DIAGNOSIS — M810 Age-related osteoporosis without current pathological fracture: Secondary | ICD-10-CM | POA: Diagnosis not present

## 2022-03-19 DIAGNOSIS — E7849 Other hyperlipidemia: Secondary | ICD-10-CM | POA: Diagnosis not present

## 2022-03-19 DIAGNOSIS — D72829 Elevated white blood cell count, unspecified: Secondary | ICD-10-CM | POA: Diagnosis not present

## 2022-03-19 DIAGNOSIS — E559 Vitamin D deficiency, unspecified: Secondary | ICD-10-CM | POA: Diagnosis not present

## 2022-03-19 DIAGNOSIS — I10 Essential (primary) hypertension: Secondary | ICD-10-CM | POA: Diagnosis not present

## 2022-03-22 DIAGNOSIS — M8580 Other specified disorders of bone density and structure, unspecified site: Secondary | ICD-10-CM | POA: Diagnosis not present

## 2022-03-22 DIAGNOSIS — F411 Generalized anxiety disorder: Secondary | ICD-10-CM | POA: Diagnosis not present

## 2022-03-22 DIAGNOSIS — I700 Atherosclerosis of aorta: Secondary | ICD-10-CM | POA: Diagnosis not present

## 2022-03-22 DIAGNOSIS — Z6824 Body mass index (BMI) 24.0-24.9, adult: Secondary | ICD-10-CM | POA: Diagnosis not present

## 2022-03-22 DIAGNOSIS — I10 Essential (primary) hypertension: Secondary | ICD-10-CM | POA: Diagnosis not present

## 2022-03-22 DIAGNOSIS — Z87891 Personal history of nicotine dependence: Secondary | ICD-10-CM | POA: Diagnosis not present

## 2022-03-22 DIAGNOSIS — E7849 Other hyperlipidemia: Secondary | ICD-10-CM | POA: Diagnosis not present

## 2022-05-31 DIAGNOSIS — N8110 Cystocele, unspecified: Secondary | ICD-10-CM | POA: Diagnosis not present

## 2022-06-13 DIAGNOSIS — N8110 Cystocele, unspecified: Secondary | ICD-10-CM | POA: Diagnosis not present

## 2022-06-29 DIAGNOSIS — H25013 Cortical age-related cataract, bilateral: Secondary | ICD-10-CM | POA: Diagnosis not present

## 2022-06-29 DIAGNOSIS — H52203 Unspecified astigmatism, bilateral: Secondary | ICD-10-CM | POA: Diagnosis not present

## 2022-06-29 DIAGNOSIS — H43813 Vitreous degeneration, bilateral: Secondary | ICD-10-CM | POA: Diagnosis not present

## 2022-06-29 DIAGNOSIS — H04123 Dry eye syndrome of bilateral lacrimal glands: Secondary | ICD-10-CM | POA: Diagnosis not present

## 2022-06-29 DIAGNOSIS — H5213 Myopia, bilateral: Secondary | ICD-10-CM | POA: Diagnosis not present

## 2022-06-29 DIAGNOSIS — H2513 Age-related nuclear cataract, bilateral: Secondary | ICD-10-CM | POA: Diagnosis not present

## 2022-06-29 DIAGNOSIS — H524 Presbyopia: Secondary | ICD-10-CM | POA: Diagnosis not present

## 2022-09-03 ENCOUNTER — Ambulatory Visit: Payer: Medicare Other | Admitting: Dermatology

## 2022-10-22 DIAGNOSIS — I10 Essential (primary) hypertension: Secondary | ICD-10-CM | POA: Diagnosis not present

## 2022-10-22 DIAGNOSIS — Z0001 Encounter for general adult medical examination with abnormal findings: Secondary | ICD-10-CM | POA: Diagnosis not present

## 2022-10-22 DIAGNOSIS — E041 Nontoxic single thyroid nodule: Secondary | ICD-10-CM | POA: Diagnosis not present

## 2022-10-22 DIAGNOSIS — E7849 Other hyperlipidemia: Secondary | ICD-10-CM | POA: Diagnosis not present

## 2022-10-29 DIAGNOSIS — Z0001 Encounter for general adult medical examination with abnormal findings: Secondary | ICD-10-CM | POA: Diagnosis not present

## 2022-10-29 DIAGNOSIS — I10 Essential (primary) hypertension: Secondary | ICD-10-CM | POA: Diagnosis not present

## 2022-10-29 DIAGNOSIS — F411 Generalized anxiety disorder: Secondary | ICD-10-CM | POA: Diagnosis not present

## 2022-10-29 DIAGNOSIS — E7849 Other hyperlipidemia: Secondary | ICD-10-CM | POA: Diagnosis not present

## 2022-10-29 DIAGNOSIS — N814 Uterovaginal prolapse, unspecified: Secondary | ICD-10-CM | POA: Diagnosis not present

## 2022-10-29 DIAGNOSIS — M8580 Other specified disorders of bone density and structure, unspecified site: Secondary | ICD-10-CM | POA: Diagnosis not present

## 2022-10-29 DIAGNOSIS — I700 Atherosclerosis of aorta: Secondary | ICD-10-CM | POA: Diagnosis not present

## 2022-10-29 DIAGNOSIS — R599 Enlarged lymph nodes, unspecified: Secondary | ICD-10-CM | POA: Diagnosis not present

## 2022-10-29 DIAGNOSIS — K625 Hemorrhage of anus and rectum: Secondary | ICD-10-CM | POA: Diagnosis not present

## 2022-10-29 DIAGNOSIS — K635 Polyp of colon: Secondary | ICD-10-CM | POA: Diagnosis not present

## 2022-10-29 DIAGNOSIS — Z87891 Personal history of nicotine dependence: Secondary | ICD-10-CM | POA: Diagnosis not present

## 2022-10-29 DIAGNOSIS — N8110 Cystocele, unspecified: Secondary | ICD-10-CM | POA: Diagnosis not present

## 2022-11-02 DIAGNOSIS — Z122 Encounter for screening for malignant neoplasm of respiratory organs: Secondary | ICD-10-CM | POA: Diagnosis not present

## 2022-11-02 DIAGNOSIS — F17210 Nicotine dependence, cigarettes, uncomplicated: Secondary | ICD-10-CM | POA: Diagnosis not present

## 2022-11-06 DIAGNOSIS — Z1231 Encounter for screening mammogram for malignant neoplasm of breast: Secondary | ICD-10-CM | POA: Diagnosis not present

## 2022-11-19 DIAGNOSIS — N819 Female genital prolapse, unspecified: Secondary | ICD-10-CM | POA: Diagnosis not present

## 2022-12-11 DIAGNOSIS — H2513 Age-related nuclear cataract, bilateral: Secondary | ICD-10-CM | POA: Diagnosis not present

## 2022-12-11 DIAGNOSIS — H5213 Myopia, bilateral: Secondary | ICD-10-CM | POA: Diagnosis not present

## 2022-12-11 DIAGNOSIS — H04123 Dry eye syndrome of bilateral lacrimal glands: Secondary | ICD-10-CM | POA: Diagnosis not present

## 2022-12-11 DIAGNOSIS — H25013 Cortical age-related cataract, bilateral: Secondary | ICD-10-CM | POA: Diagnosis not present

## 2022-12-11 DIAGNOSIS — H11823 Conjunctivochalasis, bilateral: Secondary | ICD-10-CM | POA: Diagnosis not present

## 2022-12-11 DIAGNOSIS — H524 Presbyopia: Secondary | ICD-10-CM | POA: Diagnosis not present

## 2022-12-11 DIAGNOSIS — H43813 Vitreous degeneration, bilateral: Secondary | ICD-10-CM | POA: Diagnosis not present

## 2023-02-04 DIAGNOSIS — Z23 Encounter for immunization: Secondary | ICD-10-CM | POA: Diagnosis not present

## 2023-03-28 DIAGNOSIS — K469 Unspecified abdominal hernia without obstruction or gangrene: Secondary | ICD-10-CM | POA: Diagnosis not present

## 2023-03-28 DIAGNOSIS — N816 Rectocele: Secondary | ICD-10-CM | POA: Diagnosis not present

## 2023-03-28 DIAGNOSIS — N819 Female genital prolapse, unspecified: Secondary | ICD-10-CM | POA: Diagnosis not present

## 2023-03-28 DIAGNOSIS — N958 Other specified menopausal and perimenopausal disorders: Secondary | ICD-10-CM | POA: Diagnosis not present

## 2023-03-28 DIAGNOSIS — N3941 Urge incontinence: Secondary | ICD-10-CM | POA: Diagnosis not present

## 2023-04-24 DIAGNOSIS — I10 Essential (primary) hypertension: Secondary | ICD-10-CM | POA: Diagnosis not present

## 2023-04-24 DIAGNOSIS — E7849 Other hyperlipidemia: Secondary | ICD-10-CM | POA: Diagnosis not present

## 2023-04-24 DIAGNOSIS — E559 Vitamin D deficiency, unspecified: Secondary | ICD-10-CM | POA: Diagnosis not present

## 2023-04-29 DIAGNOSIS — Z87891 Personal history of nicotine dependence: Secondary | ICD-10-CM | POA: Diagnosis not present

## 2023-04-29 DIAGNOSIS — K625 Hemorrhage of anus and rectum: Secondary | ICD-10-CM | POA: Diagnosis not present

## 2023-04-29 DIAGNOSIS — M8580 Other specified disorders of bone density and structure, unspecified site: Secondary | ICD-10-CM | POA: Diagnosis not present

## 2023-04-29 DIAGNOSIS — Z6824 Body mass index (BMI) 24.0-24.9, adult: Secondary | ICD-10-CM | POA: Diagnosis not present

## 2023-04-29 DIAGNOSIS — F411 Generalized anxiety disorder: Secondary | ICD-10-CM | POA: Diagnosis not present

## 2023-04-29 DIAGNOSIS — I700 Atherosclerosis of aorta: Secondary | ICD-10-CM | POA: Diagnosis not present

## 2023-04-29 DIAGNOSIS — N8110 Cystocele, unspecified: Secondary | ICD-10-CM | POA: Diagnosis not present

## 2023-04-29 DIAGNOSIS — E7849 Other hyperlipidemia: Secondary | ICD-10-CM | POA: Diagnosis not present

## 2023-04-29 DIAGNOSIS — I10 Essential (primary) hypertension: Secondary | ICD-10-CM | POA: Diagnosis not present

## 2023-04-29 DIAGNOSIS — N814 Uterovaginal prolapse, unspecified: Secondary | ICD-10-CM | POA: Diagnosis not present

## 2023-05-15 DIAGNOSIS — K625 Hemorrhage of anus and rectum: Secondary | ICD-10-CM | POA: Diagnosis not present

## 2023-05-15 DIAGNOSIS — I10 Essential (primary) hypertension: Secondary | ICD-10-CM | POA: Diagnosis not present

## 2023-05-15 DIAGNOSIS — Z01818 Encounter for other preprocedural examination: Secondary | ICD-10-CM | POA: Diagnosis not present

## 2023-05-15 DIAGNOSIS — K469 Unspecified abdominal hernia without obstruction or gangrene: Secondary | ICD-10-CM | POA: Diagnosis not present

## 2023-05-20 DIAGNOSIS — M6281 Muscle weakness (generalized): Secondary | ICD-10-CM | POA: Diagnosis not present

## 2023-05-20 DIAGNOSIS — N814 Uterovaginal prolapse, unspecified: Secondary | ICD-10-CM | POA: Diagnosis not present

## 2023-05-23 DIAGNOSIS — N814 Uterovaginal prolapse, unspecified: Secondary | ICD-10-CM | POA: Diagnosis not present

## 2023-05-23 DIAGNOSIS — M6281 Muscle weakness (generalized): Secondary | ICD-10-CM | POA: Diagnosis not present

## 2023-05-28 DIAGNOSIS — M6281 Muscle weakness (generalized): Secondary | ICD-10-CM | POA: Diagnosis not present

## 2023-05-28 DIAGNOSIS — N814 Uterovaginal prolapse, unspecified: Secondary | ICD-10-CM | POA: Diagnosis not present

## 2023-05-30 DIAGNOSIS — M6281 Muscle weakness (generalized): Secondary | ICD-10-CM | POA: Diagnosis not present

## 2023-05-30 DIAGNOSIS — N814 Uterovaginal prolapse, unspecified: Secondary | ICD-10-CM | POA: Diagnosis not present

## 2023-06-03 DIAGNOSIS — M6281 Muscle weakness (generalized): Secondary | ICD-10-CM | POA: Diagnosis not present

## 2023-06-03 DIAGNOSIS — N814 Uterovaginal prolapse, unspecified: Secondary | ICD-10-CM | POA: Diagnosis not present

## 2023-06-05 DIAGNOSIS — M6281 Muscle weakness (generalized): Secondary | ICD-10-CM | POA: Diagnosis not present

## 2023-06-05 DIAGNOSIS — N814 Uterovaginal prolapse, unspecified: Secondary | ICD-10-CM | POA: Diagnosis not present

## 2023-06-11 DIAGNOSIS — M6281 Muscle weakness (generalized): Secondary | ICD-10-CM | POA: Diagnosis not present

## 2023-06-11 DIAGNOSIS — N814 Uterovaginal prolapse, unspecified: Secondary | ICD-10-CM | POA: Diagnosis not present

## 2023-06-13 DIAGNOSIS — N814 Uterovaginal prolapse, unspecified: Secondary | ICD-10-CM | POA: Diagnosis not present

## 2023-06-13 DIAGNOSIS — M6281 Muscle weakness (generalized): Secondary | ICD-10-CM | POA: Diagnosis not present

## 2023-06-18 DIAGNOSIS — N814 Uterovaginal prolapse, unspecified: Secondary | ICD-10-CM | POA: Diagnosis not present

## 2023-06-18 DIAGNOSIS — M6281 Muscle weakness (generalized): Secondary | ICD-10-CM | POA: Diagnosis not present

## 2023-06-20 DIAGNOSIS — M6281 Muscle weakness (generalized): Secondary | ICD-10-CM | POA: Diagnosis not present

## 2023-06-20 DIAGNOSIS — N814 Uterovaginal prolapse, unspecified: Secondary | ICD-10-CM | POA: Diagnosis not present

## 2023-06-24 DIAGNOSIS — M6281 Muscle weakness (generalized): Secondary | ICD-10-CM | POA: Diagnosis not present

## 2023-06-24 DIAGNOSIS — N814 Uterovaginal prolapse, unspecified: Secondary | ICD-10-CM | POA: Diagnosis not present

## 2023-06-26 DIAGNOSIS — N814 Uterovaginal prolapse, unspecified: Secondary | ICD-10-CM | POA: Diagnosis not present

## 2023-06-26 DIAGNOSIS — M6281 Muscle weakness (generalized): Secondary | ICD-10-CM | POA: Diagnosis not present

## 2023-07-01 DIAGNOSIS — M6281 Muscle weakness (generalized): Secondary | ICD-10-CM | POA: Diagnosis not present

## 2023-07-01 DIAGNOSIS — N814 Uterovaginal prolapse, unspecified: Secondary | ICD-10-CM | POA: Diagnosis not present

## 2023-07-09 DIAGNOSIS — M6281 Muscle weakness (generalized): Secondary | ICD-10-CM | POA: Diagnosis not present

## 2023-07-09 DIAGNOSIS — N814 Uterovaginal prolapse, unspecified: Secondary | ICD-10-CM | POA: Diagnosis not present

## 2023-07-17 DIAGNOSIS — N814 Uterovaginal prolapse, unspecified: Secondary | ICD-10-CM | POA: Diagnosis not present

## 2023-07-17 DIAGNOSIS — N3941 Urge incontinence: Secondary | ICD-10-CM | POA: Diagnosis not present

## 2023-07-17 DIAGNOSIS — K469 Unspecified abdominal hernia without obstruction or gangrene: Secondary | ICD-10-CM | POA: Diagnosis not present

## 2023-07-17 DIAGNOSIS — M6281 Muscle weakness (generalized): Secondary | ICD-10-CM | POA: Diagnosis not present

## 2023-07-17 DIAGNOSIS — K5902 Outlet dysfunction constipation: Secondary | ICD-10-CM | POA: Diagnosis not present

## 2023-07-29 DIAGNOSIS — Z7982 Long term (current) use of aspirin: Secondary | ICD-10-CM | POA: Diagnosis not present

## 2023-07-29 DIAGNOSIS — N815 Vaginal enterocele: Secondary | ICD-10-CM | POA: Diagnosis not present

## 2023-07-29 DIAGNOSIS — F17210 Nicotine dependence, cigarettes, uncomplicated: Secondary | ICD-10-CM | POA: Diagnosis not present

## 2023-07-29 DIAGNOSIS — N819 Female genital prolapse, unspecified: Secondary | ICD-10-CM | POA: Diagnosis not present

## 2023-07-29 DIAGNOSIS — Z79899 Other long term (current) drug therapy: Secondary | ICD-10-CM | POA: Diagnosis not present

## 2023-08-11 DIAGNOSIS — M7989 Other specified soft tissue disorders: Secondary | ICD-10-CM | POA: Diagnosis not present

## 2023-08-11 DIAGNOSIS — I10 Essential (primary) hypertension: Secondary | ICD-10-CM | POA: Diagnosis not present

## 2023-08-11 DIAGNOSIS — R609 Edema, unspecified: Secondary | ICD-10-CM | POA: Diagnosis not present

## 2023-08-11 DIAGNOSIS — Z882 Allergy status to sulfonamides status: Secondary | ICD-10-CM | POA: Diagnosis not present

## 2023-08-11 DIAGNOSIS — M79605 Pain in left leg: Secondary | ICD-10-CM | POA: Diagnosis not present

## 2023-08-11 DIAGNOSIS — E785 Hyperlipidemia, unspecified: Secondary | ICD-10-CM | POA: Diagnosis not present

## 2023-08-11 DIAGNOSIS — Z87891 Personal history of nicotine dependence: Secondary | ICD-10-CM | POA: Diagnosis not present

## 2023-08-11 DIAGNOSIS — Z86718 Personal history of other venous thrombosis and embolism: Secondary | ICD-10-CM | POA: Diagnosis not present

## 2023-08-11 DIAGNOSIS — I959 Hypotension, unspecified: Secondary | ICD-10-CM | POA: Diagnosis not present

## 2023-08-11 DIAGNOSIS — M79662 Pain in left lower leg: Secondary | ICD-10-CM | POA: Diagnosis not present

## 2023-08-11 NOTE — ED Provider Notes (Signed)
 Emergency Department Provider Note    ED Clinical Impression   Final diagnoses:  Left leg pain (Primary)  Leg swelling     History   Chief Complaint  Patient presents with  . Leg Pain  . Groin Pain    The patient is a 78 year old female with a history of lower extremity DVT, presenting to the ED with left leg swelling and groin pain two weeks after undergoing robotic sacrocolpopexy for stage 3 enterocele on July 29, 2023.  The patient reports left leg swelling, which she describes as feeling weird rather than overtly painful. She also notes pain in her left calf. Additionally, she is experiencing pain in her groin, specifically in the area above the left inguinal ligament, which may be related to one of her surgical incisions. The groin pain has been present since the surgery and has persisted. The patient expresses concern about the possibility of blood clots, as she was warned about this potential complication following her surgery.  Despite these symptoms, the patient reports that she can urinate and defecate normally, indicating that the surgery has been beneficial in addressing her original condition. She denies any chest pain, trouble breathing, fever, or cough. The patient mentions that she needs to return home soon, as she has a sitter caring for her partner who has dementia.   Leg Pain Groin Pain    Past Medical History:  Diagnosis Date  . Anxiety   . Cystocele with prolapse   . DVT (deep venous thrombosis)   . Hyperlipidemia   . Hypertension     Past Surgical History:  Procedure Laterality Date  . COMBINED HYSTEROSCOPY DIAGNOSTIC / D&C    . HYSTERECTOMY    . TONSILLECTOMY      Family History  Problem Relation Age of Onset  . Breast cancer Maternal Aunt   . Cancer Maternal Aunt     Social History   Socioeconomic History  . Marital status: Widowed    Spouse name: None  . Number of children: None  . Years of education: None  . Highest education  level: None  Tobacco Use  . Smoking status: Former    Current packs/day: 0.00    Average packs/day: 1 pack/day for 31.0 years (31.0 ttl pk-yrs)    Types: Cigarettes    Start date: 01/12/1986    Quit date: 01/12/2017    Years since quitting: 6.5  . Smokeless tobacco: Never  Vaping Use  . Vaping status: Former  . Quit date: 01/12/2017  Substance and Sexual Activity  . Alcohol  use: Yes    Alcohol /week: 5.0 standard drinks of alcohol     Types: 5 Glasses of wine per week  . Drug use: Never  . Sexual activity: Not Currently    Partners: Male   Social Drivers of Health   Food Insecurity: Low Risk  (07/17/2023)   Received from [facility]   Hunger Vital Sign   . Worried About Programme researcher, broadcasting/film/video in the Last Year: Never true   . Ran Out of Food in the Last Year: Never true  Transportation Needs: No Transportation Needs (07/17/2023)   Received from [facility]   . In the past 12 months, has lack of reliable transportation kept you from medical appointments, meetings, work or from getting things needed for daily living? : No  Stress: Stress Concern Present (06/08/2020)   [employer] of Occupational Health - Occupational Stress Questionnaire   . Feeling of Stress : To some extent  Housing: Low Risk  (07/17/2023)   Received from [facility] Stability Vital Sign   . What is your living situation today?: I have a steady place to live   . Think about the place you live. Do you have problems with any of the following? Choose all that apply:: None/None on this list    Allergies  Allergen Reactions  . Sulfa (Sulfonamide Antibiotics) Other (See Comments)    unknown    Current Outpatient Medications  Medication Instructions  . amitriptyline (ELAVIL) 25 mg  . aspirin (ECOTRIN) 81 mg, Oral  . busPIRone (BUSPAR) 15 mg, Oral, 3 times a day PRN  . cholecalciferol, vitamin D3-50 mcg, 2,000 unit,, 50 mcg (2,000 unit) tablet Oral, Daily (standard)  . coenzyme Q10  200 mg, Oral  . cyanocobalamin/folic acid (VITAMIN B12-FOLIC ACID) 1,000-400 mcg Lozg 100 mcg  . fish oil-omega-3 fatty acids 300-1,000 mg capsule 2 g, Oral, Daily (standard)  . pravastatin (PRAVACHOL) 80 mg  . PREMARIN 0.3 mg    Physical Exam   BP 134/70   Pulse 75   Temp 36.4 C (97.5 F) (Oral)   Resp 17   Ht 160 cm (5' 3)   Wt 66.7 kg (147 lb)   SpO2 96%   BMI 26.04 kg/m   Physical Exam Vitals and nursing note reviewed.  Constitutional:      General: She is not in acute distress.    Appearance: She is not toxic-appearing.  HENT:     Head: Normocephalic and atraumatic.  Eyes:     Extraocular Movements: Extraocular movements intact.     Pupils: Pupils are equal, round, and reactive to light.  Cardiovascular:     Rate and Rhythm: Normal rate and regular rhythm.  Pulmonary:     Effort: Pulmonary effort is normal. No respiratory distress.     Breath sounds: Normal breath sounds.  Abdominal:     Palpations: Abdomen is soft.     Tenderness: There is no abdominal tenderness.     Comments: Has bruising bilateral lower quadrants and puncture wounds where she had recent surgery.  These areas are locally tender  Musculoskeletal:        General: Tenderness present. No deformity.     Left lower leg: Edema present.     Comments: Left lower extremity with 1+ pitting edema to the ankle that extends to the knee.  No erythema or warmth.  No cords.  Minimal calf tenderness.  There is no swelling about the knee.  Skin:    General: Skin is warm.     Capillary Refill: Capillary refill takes less than 2 seconds.  Neurological:     General: No focal deficit present.     Mental Status: She is alert.     Motor: No weakness.     ED Course   Diagnostics:   Radiology: US  Venous Doppler Lower Extremity Left    (Results Pending)    The patient will return tomorrow for venous Doppler of her left lower extremity    Medications  apixaban (ELIQUIS) tablet 10 mg (has no  administration in time range)     Medical Decision Making * Management focused on evaluating for DVT given prior history and presentation of leg swelling/groin pain. Ultrasound ordered to assess for venous thromboembolism. * While cellulitis could present with leg pain and swelling, the absence of erythema, warmth, and fever made this less likely than a DVT in the setting of recent surgery and prior history. Musculoskeletal injury  was considered, but less likely given the lack of trauma and the patient's history of DVT. * Discharge is appropriate pending ultrasound results. Patient given one dose of Eliquis for potential DVT while awaiting definitive imaging. Follow-up arranged to review results and determine further anticoagulation needs.    Amount and/or Complexity of Data Reviewed Radiology: ordered.    Diagnosis:  Diagnosis ICD-10-CM Associated Orders  1. Left leg pain  M79.605 US  Venous Doppler Lower Extremity Left    2. Leg swelling  M79.89 US  Venous Doppler Lower Extremity Left      Disposition: Disposition: Home  To return tomorrow for venous Doppler.  In the meantime she was given 1 dose of Eliquis.  No further Eliquis was prescribed until the result is available as it will be done in 24 hours.   Geraldine F Goertzen, MD    Goertzen, Geraldine Florence, MD 08/11/23 [id number]

## 2023-08-12 DIAGNOSIS — M7989 Other specified soft tissue disorders: Secondary | ICD-10-CM | POA: Diagnosis not present

## 2023-08-12 DIAGNOSIS — M79605 Pain in left leg: Secondary | ICD-10-CM | POA: Diagnosis not present

## 2023-08-16 DIAGNOSIS — F411 Generalized anxiety disorder: Secondary | ICD-10-CM | POA: Diagnosis not present

## 2023-08-16 DIAGNOSIS — Z636 Dependent relative needing care at home: Secondary | ICD-10-CM | POA: Diagnosis not present

## 2023-08-16 DIAGNOSIS — Z6824 Body mass index (BMI) 24.0-24.9, adult: Secondary | ICD-10-CM | POA: Diagnosis not present

## 2023-08-16 DIAGNOSIS — I10 Essential (primary) hypertension: Secondary | ICD-10-CM | POA: Diagnosis not present

## 2023-08-16 DIAGNOSIS — N814 Uterovaginal prolapse, unspecified: Secondary | ICD-10-CM | POA: Diagnosis not present

## 2023-09-04 DIAGNOSIS — Z9889 Other specified postprocedural states: Secondary | ICD-10-CM | POA: Diagnosis not present

## 2023-09-04 DIAGNOSIS — Z48816 Encounter for surgical aftercare following surgery on the genitourinary system: Secondary | ICD-10-CM | POA: Diagnosis not present

## 2023-09-13 ENCOUNTER — Encounter (INDEPENDENT_AMBULATORY_CARE_PROVIDER_SITE_OTHER): Payer: Self-pay | Admitting: *Deleted

## 2023-10-19 DIAGNOSIS — Z6824 Body mass index (BMI) 24.0-24.9, adult: Secondary | ICD-10-CM | POA: Diagnosis not present

## 2023-10-19 DIAGNOSIS — R2241 Localized swelling, mass and lump, right lower limb: Secondary | ICD-10-CM | POA: Diagnosis not present

## 2023-10-19 DIAGNOSIS — I10 Essential (primary) hypertension: Secondary | ICD-10-CM | POA: Diagnosis not present

## 2023-10-19 DIAGNOSIS — E119 Type 2 diabetes mellitus without complications: Secondary | ICD-10-CM | POA: Diagnosis not present

## 2023-10-19 DIAGNOSIS — R2242 Localized swelling, mass and lump, left lower limb: Secondary | ICD-10-CM | POA: Diagnosis not present

## 2023-10-30 DIAGNOSIS — I10 Essential (primary) hypertension: Secondary | ICD-10-CM | POA: Diagnosis not present

## 2023-10-30 DIAGNOSIS — E781 Pure hyperglyceridemia: Secondary | ICD-10-CM | POA: Diagnosis not present

## 2023-10-30 DIAGNOSIS — Z720 Tobacco use: Secondary | ICD-10-CM | POA: Diagnosis not present

## 2023-10-30 DIAGNOSIS — E7849 Other hyperlipidemia: Secondary | ICD-10-CM | POA: Diagnosis not present

## 2023-10-30 DIAGNOSIS — E119 Type 2 diabetes mellitus without complications: Secondary | ICD-10-CM | POA: Diagnosis not present

## 2023-10-30 DIAGNOSIS — E041 Nontoxic single thyroid nodule: Secondary | ICD-10-CM | POA: Diagnosis not present

## 2023-10-30 DIAGNOSIS — Z1329 Encounter for screening for other suspected endocrine disorder: Secondary | ICD-10-CM | POA: Diagnosis not present

## 2023-10-30 LAB — LAB REPORT - SCANNED
EGFR: 88.1
TSH: 1.591

## 2023-11-06 DIAGNOSIS — Z1331 Encounter for screening for depression: Secondary | ICD-10-CM | POA: Diagnosis not present

## 2023-11-06 DIAGNOSIS — Z6825 Body mass index (BMI) 25.0-25.9, adult: Secondary | ICD-10-CM | POA: Diagnosis not present

## 2023-11-06 DIAGNOSIS — F411 Generalized anxiety disorder: Secondary | ICD-10-CM | POA: Diagnosis not present

## 2023-11-06 DIAGNOSIS — I10 Essential (primary) hypertension: Secondary | ICD-10-CM | POA: Diagnosis not present

## 2023-11-06 DIAGNOSIS — Z1389 Encounter for screening for other disorder: Secondary | ICD-10-CM | POA: Diagnosis not present

## 2023-11-06 DIAGNOSIS — Z0001 Encounter for general adult medical examination with abnormal findings: Secondary | ICD-10-CM | POA: Diagnosis not present

## 2023-11-06 DIAGNOSIS — R2242 Localized swelling, mass and lump, left lower limb: Secondary | ICD-10-CM | POA: Diagnosis not present

## 2023-11-06 DIAGNOSIS — Z0000 Encounter for general adult medical examination without abnormal findings: Secondary | ICD-10-CM | POA: Diagnosis not present

## 2023-11-06 DIAGNOSIS — R2241 Localized swelling, mass and lump, right lower limb: Secondary | ICD-10-CM | POA: Diagnosis not present

## 2023-11-12 DIAGNOSIS — Z1231 Encounter for screening mammogram for malignant neoplasm of breast: Secondary | ICD-10-CM | POA: Diagnosis not present

## 2023-12-05 DIAGNOSIS — H52203 Unspecified astigmatism, bilateral: Secondary | ICD-10-CM | POA: Diagnosis not present

## 2023-12-05 DIAGNOSIS — H5213 Myopia, bilateral: Secondary | ICD-10-CM | POA: Diagnosis not present

## 2023-12-05 DIAGNOSIS — H2513 Age-related nuclear cataract, bilateral: Secondary | ICD-10-CM | POA: Diagnosis not present

## 2023-12-05 DIAGNOSIS — H04123 Dry eye syndrome of bilateral lacrimal glands: Secondary | ICD-10-CM | POA: Diagnosis not present

## 2023-12-05 DIAGNOSIS — H25013 Cortical age-related cataract, bilateral: Secondary | ICD-10-CM | POA: Diagnosis not present

## 2023-12-31 ENCOUNTER — Encounter: Payer: Self-pay | Admitting: *Deleted

## 2023-12-31 NOTE — Progress Notes (Unsigned)
 Cardiology Office Note   Date:  01/01/2024   ID:  Sarah Coffey, DOB November 12, 1945, MRN [id number]  PCP:  Lari Elspeth BRAVO, MD  Cardiologist:   Lynwood Schilling, MD Referring:  Lari Elspeth BRAVO, MD  Chief Complaint  Patient presents with   Edema      History of Present Illness: Sarah Coffey is a 78 y.o. female who presents for evaluation of leg swelling.  She had a Doppler in April 2025 negative for evidence of DVT.   She called EMS to [facility].  I reviewed these records.  She had some discomfort, but her left groin had some edema.  But there was no clear etiology.  She was thought to have edema related to amlodipine which was discontinued and her swelling went away.  She is otherwise not had any cardiovascular complaints.  She stays very active.  She is a caregiver.  She denies any symptoms such as chest discomfort, neck or arm discomfort.  She does not have any palpitations, presyncope or syncope.  She denies any shortness of breath, PND or orthopnea.  She does a lot of yard activities and housework.   Past Medical History:  Diagnosis Date   Anxiety    Basal cell carcinoma 12/12/2015   nod- sup- - mid forehead (EXC)    Past Surgical History:  Procedure Laterality Date   ABDOMINAL HYSTERECTOMY     COLONOSCOPY WITH PROPOFOL  N/A 10/11/2020   Procedure: COLONOSCOPY WITH PROPOFOL ;  Surgeon: Eartha Angelia Sieving, MD;  Location: [facility];  Service: Gastroenterology;  Laterality: N/A;  12:50   CYST EXCISION     tale bone   POLYPECTOMY  10/11/2020   Procedure: POLYPECTOMY;  Surgeon: Eartha Angelia, Sieving, MD;  Location: [facility];  Service: Gastroenterology;;   ROBOTIC ASSISTED LAPAROSCOPIC SACROCOLPOPEXY     TONSILLECTOMY       Current Outpatient Medications  Medication Sig Dispense Refill   acetaminophen (TYLENOL) 500 MG tablet Take 500-1,000 mg by mouth every 6 (six) hours as needed (pain).     amitriptyline (ELAVIL) 25 MG tablet Take 25 mg by  mouth at bedtime.     aspirin 81 MG EC tablet Take 81 mg by mouth at bedtime.     busPIRone (BUSPAR) 15 MG tablet Take 7.5 mg by mouth 2 (two) times daily as needed (anxiety).     cholecalciferol (VITAMIN D3) 25 MCG (1000 UNIT) tablet Take 1,000 Units by mouth at bedtime.     Coenzyme Q10 (COQ10) 200 MG CAPS Take 200 mg by mouth at bedtime.     DHA-EPA-Vitamin E (OMEGA-3 COMPLEX PO) Take 1 capsule by mouth at bedtime. Omega XL     estrogens, conjugated, (PREMARIN) 0.3 MG tablet Take 0.3 mg by mouth at bedtime.     ibuprofen (ADVIL) 200 MG tablet Take 200-400 mg by mouth every 8 (eight) hours as needed (pain.).     metoprolol succinate (TOPROL-XL) 25 MG 24 hr tablet Take 25 mg by mouth daily.     pravastatin (PRAVACHOL) 80 MG tablet Take 80 mg by mouth at bedtime.     vitamin B-12 (CYANOCOBALAMIN) 1000 MCG tablet Take 1,000 mcg by mouth daily.     No current facility-administered medications for this visit.    Allergies:   Sulfa antibiotics    Social History:  The patient  reports that she has quit smoking. She has quit using smokeless tobacco.   Family History:  The patient's family history is not on file.  ROS:  Please see the history of present illness.   Otherwise, review of systems are positive for none.   All other systems are reviewed and negative.    PHYSICAL EXAM: VS:  BP (!) 160/82   Pulse 85   Ht 5' 3 (1.6 m)   Wt 140 lb (63.5 kg)   BMI 24.80 kg/m  , BMI Body mass index is 24.8 kg/m. GENERAL:  Well appearing HEENT:  Pupils equal round and reactive, fundi not visualized, oral mucosa unremarkable NECK:  No jugular venous distention, waveform within normal limits, carotid upstroke brisk and symmetric, no bruits, no thyromegaly LYMPHATICS:  No cervical, inguinal adenopathy LUNGS:  Clear to auscultation bilaterally BACK:  No CVA tenderness CHEST:  Unremarkable HEART:  PMI not displaced or sustained,S1 and S2 within normal limits, no S3, no S4, no clicks, no rubs, no  murmurs ABD:  Flat, positive bowel sounds normal in frequency in pitch, no bruits, no rebound, no guarding, no midline pulsatile mass, no hepatomegaly, no splenomegaly EXT:  2 plus pulses throughout, no edema, no cyanosis no clubbing SKIN:  No rashes no nodules NEURO:  Cranial nerves II through XII grossly intact, motor grossly intact throughout PSYCH:  Cognitively intact, oriented to person place and time    EKG:  EKG Interpretation Date/Time:  Wednesday January 01 2024 14:15:43 EDT Ventricular Rate:  85 PR Interval:  166 QRS Duration:  64 QT Interval:  378 QTC Calculation: 449 R Axis:   24  Text Interpretation: Normal sinus rhythm Poor anterior R wave progression No previous ECGs available Confirmed by Lavona Agent ([id number]) on 01/01/2024 2:41:32 PM     Recent Labs: No results found for requested labs within last 365 days.    Lipid Panel No results found for: CHOL, TRIG, HDL, CHOLHDL, VLDL, LDLCALC, LDLDIRECT    Wt Readings from Last 3 Encounters:  01/01/24 140 lb (63.5 kg)  10/11/20 145 lb (65.8 kg)      Other studies Reviewed: Additional studies/ records that were reviewed today include: [facility] records. Review of the above records demonstrates:  Please see elsewhere in the note.     ASSESSMENT AND PLAN:    Edema: This seems to have been related to the amlodipine.  I would not suspect heart failure.  Her evaluation at [facility] was unremarkable.  No further workup.  HTN: Her blood pressure is elevated but she says it is in the 130s at home.  She is going to keep a blood pressure diary and I would suggest using Diovan if her blood pressure is not well-controlled.  For now she will remain on the low-dose metoprolol.  Risk reduction: I will be  asking for her blood work from her primary provider and I would be happy to review her lipids.  Current medicines are reviewed at length with the patient today.  The patient does not have concerns regarding  medicines.  The following changes have been made:  no change  Labs/ tests ordered today include:   Orders Placed This Encounter  Procedures   EKG 12-Lead     Disposition:   FU with with me as needed   Signed, Agent Lavona, MD  01/01/2024 3:17 PM    [facility]

## 2024-01-01 ENCOUNTER — Encounter: Payer: Self-pay | Admitting: Cardiology

## 2024-01-01 ENCOUNTER — Encounter: Payer: Self-pay | Admitting: *Deleted

## 2024-01-01 ENCOUNTER — Ambulatory Visit (INDEPENDENT_AMBULATORY_CARE_PROVIDER_SITE_OTHER): Admitting: Cardiology

## 2024-01-01 VITALS — BP 160/82 | HR 85 | Ht 63.0 in | Wt 140.0 lb

## 2024-01-01 DIAGNOSIS — M7989 Other specified soft tissue disorders: Secondary | ICD-10-CM

## 2024-01-01 NOTE — Patient Instructions (Addendum)
Medication Instructions:  Continue all current medications.  Labwork: none  Testing/Procedures: none  Follow-Up: As needed.    Any Other Special Instructions Will Be Listed Below (If Applicable).  If you need a refill on your cardiac medications before your next appointment, please call your pharmacy.  

## 2024-01-05 ENCOUNTER — Ambulatory Visit: Payer: Self-pay | Admitting: Cardiology

## 2024-01-13 DIAGNOSIS — F411 Generalized anxiety disorder: Secondary | ICD-10-CM | POA: Diagnosis not present

## 2024-01-13 DIAGNOSIS — I10 Essential (primary) hypertension: Secondary | ICD-10-CM | POA: Diagnosis not present

## 2024-01-13 DIAGNOSIS — Z6824 Body mass index (BMI) 24.0-24.9, adult: Secondary | ICD-10-CM | POA: Diagnosis not present

## 2024-01-29 DIAGNOSIS — I10 Essential (primary) hypertension: Secondary | ICD-10-CM | POA: Diagnosis not present

## 2024-01-29 DIAGNOSIS — Z23 Encounter for immunization: Secondary | ICD-10-CM | POA: Diagnosis not present

## 2024-01-29 DIAGNOSIS — F411 Generalized anxiety disorder: Secondary | ICD-10-CM | POA: Diagnosis not present

## 2024-01-29 DIAGNOSIS — Z636 Dependent relative needing care at home: Secondary | ICD-10-CM | POA: Diagnosis not present

## 2024-01-29 DIAGNOSIS — Z6825 Body mass index (BMI) 25.0-25.9, adult: Secondary | ICD-10-CM | POA: Diagnosis not present

## 2024-03-25 DIAGNOSIS — Z780 Asymptomatic menopausal state: Secondary | ICD-10-CM | POA: Diagnosis not present

## 2024-03-25 DIAGNOSIS — M810 Age-related osteoporosis without current pathological fracture: Secondary | ICD-10-CM | POA: Diagnosis not present

## 2024-03-31 DIAGNOSIS — Z23 Encounter for immunization: Secondary | ICD-10-CM | POA: Diagnosis not present

## 2024-04-01 DIAGNOSIS — Z23 Encounter for immunization: Secondary | ICD-10-CM | POA: Diagnosis not present
